# Patient Record
Sex: Female | Born: 1955
Health system: Southern US, Community
[De-identification: ages and names within clinical notes are randomized; demographics above are authoritative.]

## PROBLEM LIST (undated history)

## (undated) DIAGNOSIS — E785 Hyperlipidemia, unspecified: Secondary | ICD-10-CM

## (undated) DIAGNOSIS — B009 Herpesviral infection, unspecified: Secondary | ICD-10-CM

## (undated) DIAGNOSIS — F419 Anxiety disorder, unspecified: Secondary | ICD-10-CM

## (undated) DIAGNOSIS — N289 Disorder of kidney and ureter, unspecified: Secondary | ICD-10-CM

## (undated) DIAGNOSIS — K8020 Calculus of gallbladder without cholecystitis without obstruction: Secondary | ICD-10-CM

## (undated) DIAGNOSIS — E119 Type 2 diabetes mellitus without complications: Secondary | ICD-10-CM

## (undated) DIAGNOSIS — G43909 Migraine, unspecified, not intractable, without status migrainosus: Secondary | ICD-10-CM

## (undated) HISTORY — DX: Calculus of gallbladder without cholecystitis without obstruction: K80.20

## (undated) HISTORY — DX: Type 2 diabetes mellitus without complications: E11.9

## (undated) HISTORY — PX: APPENDECTOMY: SHX54

## (undated) HISTORY — PX: LITHOTRIPSY: SUR834

## (undated) HISTORY — DX: Hyperlipidemia, unspecified: E78.5

## (undated) HISTORY — DX: Anxiety disorder, unspecified: F41.9

## (undated) HISTORY — DX: Herpesviral infection, unspecified: B00.9

---

## 2001-05-04 ENCOUNTER — Other Ambulatory Visit: Admission: RE | Admit: 2001-05-04 | Discharge: 2001-05-04 | Payer: Self-pay | Admitting: *Deleted

## 2004-10-08 ENCOUNTER — Ambulatory Visit (HOSPITAL_COMMUNITY): Admission: RE | Admit: 2004-10-08 | Discharge: 2004-10-08 | Payer: Self-pay | Admitting: Obstetrics and Gynecology

## 2004-11-23 ENCOUNTER — Ambulatory Visit (HOSPITAL_COMMUNITY): Admission: RE | Admit: 2004-11-23 | Discharge: 2004-11-23 | Payer: Self-pay | Admitting: Urology

## 2005-02-06 ENCOUNTER — Ambulatory Visit (HOSPITAL_COMMUNITY): Admission: RE | Admit: 2005-02-06 | Discharge: 2005-02-06 | Payer: Self-pay | Admitting: Urology

## 2005-06-07 ENCOUNTER — Ambulatory Visit (HOSPITAL_COMMUNITY): Admission: RE | Admit: 2005-06-07 | Discharge: 2005-06-07 | Payer: Self-pay | Admitting: Urology

## 2006-01-13 ENCOUNTER — Ambulatory Visit (HOSPITAL_COMMUNITY): Admission: RE | Admit: 2006-01-13 | Discharge: 2006-01-13 | Payer: Self-pay | Admitting: Family Medicine

## 2006-01-27 ENCOUNTER — Ambulatory Visit (HOSPITAL_COMMUNITY): Admission: RE | Admit: 2006-01-27 | Discharge: 2006-01-27 | Payer: Self-pay | Admitting: Family Medicine

## 2006-01-29 ENCOUNTER — Ambulatory Visit (HOSPITAL_COMMUNITY): Admission: RE | Admit: 2006-01-29 | Discharge: 2006-01-29 | Payer: Self-pay | Admitting: Family Medicine

## 2006-01-31 ENCOUNTER — Ambulatory Visit (HOSPITAL_COMMUNITY): Admission: RE | Admit: 2006-01-31 | Discharge: 2006-01-31 | Payer: Self-pay | Admitting: Family Medicine

## 2007-07-22 ENCOUNTER — Ambulatory Visit (HOSPITAL_COMMUNITY): Admission: RE | Admit: 2007-07-22 | Discharge: 2007-07-22 | Payer: Self-pay | Admitting: Cardiovascular Disease

## 2007-07-24 ENCOUNTER — Ambulatory Visit (HOSPITAL_COMMUNITY): Admission: RE | Admit: 2007-07-24 | Discharge: 2007-07-24 | Payer: Self-pay | Admitting: Family Medicine

## 2007-07-30 ENCOUNTER — Encounter (HOSPITAL_COMMUNITY): Admission: RE | Admit: 2007-07-30 | Discharge: 2007-08-29 | Payer: Self-pay | Admitting: Family Medicine

## 2008-04-05 ENCOUNTER — Ambulatory Visit (HOSPITAL_COMMUNITY): Admission: RE | Admit: 2008-04-05 | Discharge: 2008-04-05 | Payer: Self-pay | Admitting: Family Medicine

## 2009-08-14 ENCOUNTER — Ambulatory Visit (HOSPITAL_COMMUNITY): Admission: RE | Admit: 2009-08-14 | Discharge: 2009-08-14 | Payer: Self-pay | Admitting: Family Medicine

## 2010-09-30 ENCOUNTER — Encounter: Payer: Self-pay | Admitting: Obstetrics and Gynecology

## 2011-04-24 ENCOUNTER — Other Ambulatory Visit: Payer: Self-pay | Admitting: Adult Health

## 2011-04-24 ENCOUNTER — Other Ambulatory Visit (HOSPITAL_COMMUNITY)
Admission: RE | Admit: 2011-04-24 | Discharge: 2011-04-24 | Disposition: A | Discharge status: Home / Self Care | Payer: BC Managed Care – HMO | Source: Ambulatory Visit | Attending: Obstetrics and Gynecology | Admitting: Obstetrics and Gynecology

## 2011-04-24 DIAGNOSIS — Z01419 Encounter for gynecological examination (general) (routine) without abnormal findings: Secondary | ICD-10-CM | POA: Insufficient documentation

## 2011-04-24 DIAGNOSIS — Z139 Encounter for screening, unspecified: Secondary | ICD-10-CM

## 2011-04-29 ENCOUNTER — Ambulatory Visit (HOSPITAL_COMMUNITY)
Admission: RE | Admit: 2011-04-29 | Discharge: 2011-04-29 | Disposition: A | Discharge status: Home / Self Care | Payer: BC Managed Care – HMO | Source: Ambulatory Visit | Attending: Adult Health | Admitting: Adult Health

## 2011-04-29 DIAGNOSIS — Z1231 Encounter for screening mammogram for malignant neoplasm of breast: Secondary | ICD-10-CM | POA: Insufficient documentation

## 2011-04-29 DIAGNOSIS — Z139 Encounter for screening, unspecified: Secondary | ICD-10-CM

## 2013-10-23 ENCOUNTER — Encounter (HOSPITAL_COMMUNITY): Payer: Self-pay | Admitting: Emergency Medicine

## 2013-10-23 ENCOUNTER — Emergency Department (HOSPITAL_COMMUNITY)
Admission: EM | Admit: 2013-10-23 | Discharge: 2013-10-23 | Disposition: A | Discharge status: Home / Self Care | Payer: Self-pay | Attending: Emergency Medicine | Admitting: Emergency Medicine

## 2013-10-23 ENCOUNTER — Emergency Department (HOSPITAL_COMMUNITY): Payer: BC Managed Care – HMO

## 2013-10-23 DIAGNOSIS — F411 Generalized anxiety disorder: Secondary | ICD-10-CM | POA: Insufficient documentation

## 2013-10-23 DIAGNOSIS — G43909 Migraine, unspecified, not intractable, without status migrainosus: Secondary | ICD-10-CM | POA: Insufficient documentation

## 2013-10-23 DIAGNOSIS — F419 Anxiety disorder, unspecified: Secondary | ICD-10-CM

## 2013-10-23 HISTORY — DX: Migraine, unspecified, not intractable, without status migrainosus: G43.909

## 2013-10-23 LAB — CBC WITH DIFFERENTIAL/PLATELET
Basophils Absolute: 0 10*3/uL (ref 0.0–0.1)
Basophils Relative: 0 % (ref 0–1)
EOS ABS: 0 10*3/uL (ref 0.0–0.7)
Eosinophils Relative: 1 % (ref 0–5)
HCT: 39.1 % (ref 36.0–46.0)
HEMOGLOBIN: 13.1 g/dL (ref 12.0–15.0)
LYMPHS ABS: 0.8 10*3/uL (ref 0.7–4.0)
LYMPHS PCT: 10 % — AB (ref 12–46)
MCH: 28.9 pg (ref 26.0–34.0)
MCHC: 33.5 g/dL (ref 30.0–36.0)
MCV: 86.3 fL (ref 78.0–100.0)
MONOS PCT: 3 % (ref 3–12)
Monocytes Absolute: 0.3 10*3/uL (ref 0.1–1.0)
NEUTROS PCT: 86 % — AB (ref 43–77)
Neutro Abs: 7.2 10*3/uL (ref 1.7–7.7)
Platelets: 155 10*3/uL (ref 150–400)
RBC: 4.53 MIL/uL (ref 3.87–5.11)
RDW: 13 % (ref 11.5–15.5)
WBC: 8.4 10*3/uL (ref 4.0–10.5)

## 2013-10-23 LAB — BASIC METABOLIC PANEL
BUN: 12 mg/dL (ref 6–23)
CHLORIDE: 101 meq/L (ref 96–112)
CO2: 25 meq/L (ref 19–32)
Calcium: 8.8 mg/dL (ref 8.4–10.5)
Creatinine, Ser: 0.74 mg/dL (ref 0.50–1.10)
GFR calc Af Amer: >90 mL/min (ref >90)
GFR calc non Af Amer: >90 mL/min (ref >90)
GLUCOSE: 173 mg/dL — AB (ref 70–99)
POTASSIUM: 3.7 meq/L (ref 3.7–5.3)
SODIUM: 139 meq/L (ref 137–147)

## 2013-10-23 MED ORDER — PROCHLORPERAZINE EDISYLATE 5 MG/ML IJ SOLN
10.0000 mg | Freq: Once | INTRAMUSCULAR | Status: AC
  Administered 2013-10-23: 10 mg via INTRAVENOUS
  Filled 2013-10-23: qty 2

## 2013-10-23 MED ORDER — ALPRAZOLAM 0.5 MG PO TABS: 0.5000 mg | ORAL_TABLET | Freq: Every evening | ORAL | Status: DC | PRN

## 2013-10-23 MED ORDER — SODIUM CHLORIDE 0.9 % IV BOLUS (SEPSIS)
500.0000 mL | Freq: Once | INTRAVENOUS | Status: AC
  Administered 2013-10-23: 500 mL via INTRAVENOUS

## 2013-10-23 MED ORDER — KETOROLAC TROMETHAMINE 30 MG/ML IJ SOLN
30.0000 mg | Freq: Once | INTRAMUSCULAR | Status: AC
  Administered 2013-10-23: 30 mg via INTRAVENOUS
  Filled 2013-10-23: qty 1

## 2013-10-23 MED ORDER — DIPHENHYDRAMINE HCL 50 MG/ML IJ SOLN
25.0000 mg | Freq: Once | INTRAMUSCULAR | Status: AC
  Administered 2013-10-23: 25 mg via INTRAVENOUS
  Filled 2013-10-23: qty 1

## 2013-10-23 NOTE — ED Notes (Addendum)
Per ems pt just lost husband one year ago today and got upset. EMS states was initially very anxious but that is gone and had progressively gotten to a migraine. Pt has eyes covered. Lights out for comfort. Pt alert/oriented. Morphine in route did not help per pt. Pt sees jagged edges but states is normal for her when she has migraines.

## 2013-10-23 NOTE — Discharge Instructions (Signed)
Migraine Headache °A migraine headache is an intense, throbbing pain on one or both sides of your head. A migraine can last for 30 minutes to several hours. °CAUSES  °The exact cause of a migraine headache is not always known. However, a migraine may be caused when nerves in the brain become irritated and release chemicals that cause inflammation. This causes pain. °Certain things may also trigger migraines, such as: °· Alcohol. °· Smoking. °· Stress. °· Menstruation. °· Aged cheeses. °· Foods or drinks that contain nitrates, glutamate, aspartame, or tyramine. °· Lack of sleep. °· Chocolate. °· Caffeine. °· Hunger. °· Physical exertion. °· Fatigue. °· Medicines used to treat chest pain (nitroglycerine), birth control pills, estrogen, and some blood pressure medicines. °SIGNS AND SYMPTOMS °· Pain on one or both sides of your head. °· Pulsating or throbbing pain. °· Severe pain that prevents daily activities. °· Pain that is aggravated by any physical activity. °· Nausea, vomiting, or both. °· Dizziness. °· Pain with exposure to bright lights, loud noises, or activity. °· General sensitivity to bright lights, loud noises, or smells. °Before you get a migraine, you may get warning signs that a migraine is coming (aura). An aura may include: °· Seeing flashing lights. °· Seeing bright spots, halos, or zig-zag lines. °· Having tunnel vision or blurred vision. °· Having feelings of numbness or tingling. °· Having trouble talking. °· Having muscle weakness. °DIAGNOSIS  °A migraine headache is often diagnosed based on: °· Symptoms. °· Physical exam. °· A CT scan or MRI of your head. These imaging tests cannot diagnose migraines, but they can help rule out other causes of headaches. °TREATMENT °Medicines may be given for pain and nausea. Medicines can also be given to help prevent recurrent migraines.  °HOME CARE INSTRUCTIONS °· Only take over-the-counter or prescription medicines for pain or discomfort as directed by your  health care provider. The use of long-term narcotics is not recommended. °· Lie down in a dark, quiet room when you have a migraine. °· Keep a journal to find out what may trigger your migraine headaches. For example, write down: °· What you eat and drink. °· How much sleep you get. °· Any change to your diet or medicines. °· Limit alcohol consumption. °· Quit smoking if you smoke. °· Get 7 9 hours of sleep, or as recommended by your health care provider. °· Limit stress. °· Keep lights dim if bright lights bother you and make your migraines worse. °SEEK IMMEDIATE MEDICAL CARE IF:  °· Your migraine becomes severe. °· You have a fever. °· You have a stiff neck. °· You have vision loss. °· You have muscular weakness or loss of muscle control. °· You start losing your balance or have trouble walking. °· You feel faint or pass out. °· You have severe symptoms that are different from your first symptoms. °MAKE SURE YOU:  °· Understand these instructions. °· Will watch your condition. °· Will get help right away if you are not doing well or get worse. °Document Released: 08/26/2005 Document Revised: 06/16/2013 Document Reviewed: 05/03/2013 °ExitCare® Patient Information ©2014 [employer]. ° °Panic Attacks °Panic attacks are sudden, short-lived surges of severe anxiety, fear, or discomfort. They may occur for no reason when you are relaxed, when you are anxious, or when you are sleeping. Panic attacks may occur for a number of reasons:  °· Healthy people occasionally have panic attacks in extreme, life-threatening situations, such as war or natural disasters. Normal anxiety is a protective mechanism of the   body that helps us react to danger (fight or flight response). °· Panic attacks are often seen with anxiety disorders, such as panic disorder, social anxiety disorder, generalized anxiety disorder, and phobias. Anxiety disorders cause excessive or uncontrollable anxiety. They may interfere with your relationships or  other life activities. °· Panic attacks are sometimes seen with other mental illnesses such as depression and posttraumatic stress disorder. °· Certain medical conditions, prescription medicines, and drugs of abuse can cause panic attacks. °SYMPTOMS  °Panic attacks start suddenly, peak within 20 minutes, and are accompanied by four or more of the following symptoms: °· Pounding heart or fast heart rate (palpitations). °· Sweating. °· Trembling or shaking. °· Shortness of breath or feeling smothered. °· Feeling choked. °· Chest pain or discomfort. °· Nausea or strange feeling in your stomach. °· Dizziness, lightheadedness, or feeling like you will faint. °· Chills or hot flushes. °· Numbness or tingling in your lips or hands and feet. °· Feeling that things are not real or feeling that you are not yourself. °· Fear of losing control or going crazy. °· Fear of dying. °Some of these symptoms can mimic serious medical conditions. For example, you may think you are having a heart attack. Although panic attacks can be very scary, they are not life threatening. °DIAGNOSIS  °Panic attacks are diagnosed through an assessment by your health care provider. Your health care provider will ask questions about your symptoms, such as where and when they occurred. Your health care provider will also ask about your medical history and use of alcohol and drugs, including prescription medicines. Your health care provider may order blood tests or other studies to rule out a serious medical condition. Your health care provider may refer you to a mental health professional for further evaluation. °TREATMENT  °· Most healthy people who have one or two panic attacks in an extreme, life-threatening situation will not require treatment. °· The treatment for panic attacks associated with anxiety disorders or other mental illness typically involves counseling with a mental health professional, medicine, or a combination of both. Your health  care provider will help determine what treatment is best for you. °· Panic attacks due to physical illness usually goes away with treatment of the illness. If prescription medicine is causing panic attacks, talk with your health care provider about stopping the medicine, decreasing the dose, or substituting another medicine. °· Panic attacks due to alcohol or drug abuse goes away with abstinence. Some adults need professional help in order to stop drinking or using drugs. °HOME CARE INSTRUCTIONS  °· Take all your medicines as prescribed.   °· Check with your health care provider before starting new prescription or over-the-counter medicines. °· Keep all follow up appointments with your health care provider. °SEEK MEDICAL CARE IF: °· You are not able to take your medicines as prescribed. °· Your symptoms do not improve or get worse. °SEEK IMMEDIATE MEDICAL CARE IF:  °· You experience panic attack symptoms that are different than your usual symptoms. °· You have serious thoughts about hurting yourself or others. °· You are taking medicine for panic attacks and have a serious side effect. °MAKE SURE YOU: °· Understand these instructions. °· Will watch your condition. °· Will get help right away if you are not doing well or get worse. °Document Released: 08/26/2005 Document Revised: 06/16/2013 Document Reviewed: 04/09/2013 °ExitCare® Patient Information ©2014 [employer]. ° °

## 2013-10-23 NOTE — ED Provider Notes (Signed)
CSN: [id number]     Arrival date & time 10/23/13  1742 History   First MD Initiated Contact with Patient 10/23/13 1747     Chief Complaint  Patient presents with  . Headache     (Consider location/radiation/quality/duration/timing/severity/associated sxs/prior Treatment) HPI Comments: Patient presents to the ER for evaluation of headache. Patient reports that symptoms began approximately an hour ago. She has a constant, severe pain on the top of her head. Her eyes are very sensitive to light. She has had nausea and dry heaves. The patient reports a previous history of migraines. She says that these symptoms today are very similar to her previous migraines. She has not noticed any fever, neck pain or stiffness, or extremity numbness, tingling or weakness. Does have some irregularity of her vision, but similar to previous migraines, no unusual features.  Patient reports that she lost her husband a month ago. She has been thinking about him a lot today because of Valentine's Day. She became extremely anxious prior to onset of the migraine symptoms.  Patient is a 58 y.o. female presenting with headaches.  Headache Associated symptoms: nausea and photophobia     Past Medical History  Diagnosis Date  . Migraine    History reviewed. No pertinent past surgical history. History reviewed. No pertinent family history. History  Substance Use Topics  . Smoking status: Never Smoker   . Smokeless tobacco: Not on file  . Alcohol Use: No   OB History   Grav Para Term Preterm Abortions TAB SAB Ect Mult Living                 Review of Systems  Eyes: Positive for photophobia.  Gastrointestinal: Positive for nausea.  Neurological: Positive for headaches.  Psychiatric/Behavioral: The patient is nervous/anxious.   All other systems reviewed and are negative.      Allergies  Review of patient's allergies indicates no known allergies.  Home Medications   Current Outpatient Rx  Name   Route  Sig  Dispense  Refill  . ALPRAZolam (XANAX) 0.5 MG tablet   Oral   Take 1 tablet (0.5 mg total) by mouth at bedtime as needed for anxiety.   10 tablet   0    BP 158/90  Pulse 87  Temp(Src) 97.5 F (36.4 C) (Oral)  Resp 18  SpO2 98% Physical Exam  Constitutional: She is oriented to person, place, and time. She appears well-developed and well-nourished. No distress.  HENT:  Head: Normocephalic and atraumatic.  Right Ear: Hearing normal.  Left Ear: Hearing normal.  Nose: Nose normal.  Mouth/Throat: Oropharynx is clear and moist and mucous membranes are normal.  Eyes: Conjunctivae and EOM are normal. Pupils are equal, round, and reactive to light.  Neck: Normal range of motion. Neck supple. No Brudzinski's sign and no Kernig's sign noted.  Cardiovascular: Regular rhythm, S1 normal and S2 normal.  Exam reveals no gallop and no friction rub.   No murmur heard. Pulmonary/Chest: Effort normal and breath sounds normal. No respiratory distress. She exhibits no tenderness.  Abdominal: Soft. Normal appearance and bowel sounds are normal. There is no hepatosplenomegaly. There is no tenderness. There is no rebound, no guarding, no tenderness at McBurney's point and negative Murphy's sign. No hernia.  Musculoskeletal: Normal range of motion.  Neurological: She is alert and oriented to person, place, and time. She has normal strength. No cranial nerve deficit or sensory deficit. Coordination normal. GCS eye subscore is 4. GCS verbal subscore is 5. GCS motor subscore  is 6.  5/5 strength bilateral upper and lower extremities  Skin: Skin is warm, dry and intact. No rash noted. No cyanosis.  Psychiatric: She has a normal mood and affect. Her speech is normal and behavior is normal. Thought content normal.    ED Course  Procedures (including critical care time) Labs Review Labs Reviewed  CBC WITH DIFFERENTIAL - Abnormal; Notable for the following:    Neutrophils Relative % 86 (*)     Lymphocytes Relative 10 (*)    All other components within normal limits  BASIC METABOLIC PANEL - Abnormal; Notable for the following:    Glucose, Bld 173 (*)    All other components within normal limits   Imaging Review Ct Head Wo Contrast  10/23/2013   CLINICAL DATA:  Headache.  EXAM: CT HEAD WITHOUT CONTRAST  TECHNIQUE: Contiguous axial images were obtained from the base of the skull through the vertex without intravenous contrast.  COMPARISON:  None.  FINDINGS: Bony calvarium appears intact. No mass effect or midline shift is noted. Ventricular size is within normal limits. There is no evidence of mass lesion, hemorrhage or acute infarction.  IMPRESSION: No gross intracranial abnormality seen.   Electronically Signed   By: Roque Lias M.D.   On: 10/23/2013 19:19    EKG Interpretation   None       MDM   Final diagnoses:  Anxiety  Migraine    Patient presents to the ER for evaluation of headache. Patient had severe anxiety earlier today after obtaining about the recent loss of her husband. She then noticed onset of constant headache which is similar to previous migraines. Patient had a normal neurologic exam. There are no concerning features in the history or examination. Patient's blood work was normal. Vital signs are normal except for very slight hypertension. Head CT unremarkable. Patient treated with Toradol, Compazine, Benadryl and Zofran. She has had significant improvement in her headache which is very reassuring. The patient will therefore be discharged to home. She can return for any worsening symptoms. Was given a prescription for 10 Xanax tablets to be used as needed for anxiety in the near future.    Gilda Crease, MD 10/23/13 2010

## 2013-10-23 NOTE — ED Notes (Signed)
Son stated he wanted it charted that pt has had blood vessels keep popping in one eye in past 3-4 months. Unknown which eye. edp aware

## 2013-10-23 NOTE — ED Notes (Signed)
Pt reporting improvement in pain level.  Reporting headache at 4/10 with movement.  Less when laying still.  Pt resting, no distress noted.

## 2014-11-16 ENCOUNTER — Other Ambulatory Visit (HOSPITAL_COMMUNITY): Payer: Self-pay | Admitting: Internal Medicine

## 2014-11-16 DIAGNOSIS — M8580 Other specified disorders of bone density and structure, unspecified site: Secondary | ICD-10-CM

## 2014-11-18 ENCOUNTER — Other Ambulatory Visit (HOSPITAL_COMMUNITY): Payer: Self-pay

## 2014-11-21 ENCOUNTER — Ambulatory Visit (HOSPITAL_COMMUNITY)
Admission: RE | Admit: 2014-11-21 | Discharge: 2014-11-21 | Disposition: A | Discharge status: Home / Self Care | Payer: BLUE CROSS/BLUE SHIELD | Source: Ambulatory Visit | Attending: Internal Medicine | Admitting: Internal Medicine

## 2014-11-21 DIAGNOSIS — Z780 Asymptomatic menopausal state: Secondary | ICD-10-CM | POA: Insufficient documentation

## 2014-11-21 DIAGNOSIS — M8580 Other specified disorders of bone density and structure, unspecified site: Secondary | ICD-10-CM | POA: Diagnosis not present

## 2014-11-22 ENCOUNTER — Other Ambulatory Visit (HOSPITAL_COMMUNITY): Payer: Self-pay | Admitting: Internal Medicine

## 2014-11-22 DIAGNOSIS — R945 Abnormal results of liver function studies: Secondary | ICD-10-CM

## 2014-11-25 ENCOUNTER — Ambulatory Visit (HOSPITAL_COMMUNITY)
Admission: RE | Admit: 2014-11-25 | Discharge: 2014-11-25 | Disposition: A | Discharge status: Home / Self Care | Payer: BLUE CROSS/BLUE SHIELD | Source: Ambulatory Visit | Attending: Internal Medicine | Admitting: Internal Medicine

## 2014-11-25 DIAGNOSIS — R945 Abnormal results of liver function studies: Secondary | ICD-10-CM

## 2016-01-24 DIAGNOSIS — N390 Urinary tract infection, site not specified: Secondary | ICD-10-CM | POA: Diagnosis not present

## 2016-04-30 DIAGNOSIS — E782 Mixed hyperlipidemia: Secondary | ICD-10-CM | POA: Diagnosis not present

## 2016-04-30 DIAGNOSIS — I10 Essential (primary) hypertension: Secondary | ICD-10-CM | POA: Diagnosis not present

## 2016-04-30 DIAGNOSIS — E119 Type 2 diabetes mellitus without complications: Secondary | ICD-10-CM | POA: Diagnosis not present

## 2016-05-06 DIAGNOSIS — E782 Mixed hyperlipidemia: Secondary | ICD-10-CM | POA: Diagnosis not present

## 2016-05-06 DIAGNOSIS — E119 Type 2 diabetes mellitus without complications: Secondary | ICD-10-CM | POA: Diagnosis not present

## 2016-05-06 DIAGNOSIS — L408 Other psoriasis: Secondary | ICD-10-CM | POA: Diagnosis not present

## 2016-05-06 DIAGNOSIS — F411 Generalized anxiety disorder: Secondary | ICD-10-CM | POA: Diagnosis not present

## 2016-07-30 ENCOUNTER — Ambulatory Visit (INDEPENDENT_AMBULATORY_CARE_PROVIDER_SITE_OTHER): Payer: BLUE CROSS/BLUE SHIELD | Admitting: Adult Health

## 2016-07-30 ENCOUNTER — Encounter: Payer: Self-pay | Admitting: Adult Health

## 2016-07-30 ENCOUNTER — Other Ambulatory Visit (HOSPITAL_COMMUNITY)
Admission: RE | Admit: 2016-07-30 | Discharge: 2016-07-30 | Disposition: A | Discharge status: Home / Self Care | Payer: BLUE CROSS/BLUE SHIELD | Source: Ambulatory Visit | Attending: Adult Health | Admitting: Adult Health

## 2016-07-30 VITALS — BP 140/86 | HR 98 | Ht 61.0 in | Wt 138.0 lb

## 2016-07-30 DIAGNOSIS — Z1151 Encounter for screening for human papillomavirus (HPV): Secondary | ICD-10-CM | POA: Diagnosis not present

## 2016-07-30 DIAGNOSIS — Z01419 Encounter for gynecological examination (general) (routine) without abnormal findings: Secondary | ICD-10-CM | POA: Insufficient documentation

## 2016-07-30 DIAGNOSIS — Z01411 Encounter for gynecological examination (general) (routine) with abnormal findings: Secondary | ICD-10-CM

## 2016-07-30 DIAGNOSIS — Z1211 Encounter for screening for malignant neoplasm of colon: Secondary | ICD-10-CM | POA: Diagnosis not present

## 2016-07-30 DIAGNOSIS — L300 Nummular dermatitis: Secondary | ICD-10-CM

## 2016-07-30 LAB — HEMOCCULT GUIAC POC 1CARD (OFFICE): FECAL OCCULT BLD: NEGATIVE

## 2016-07-30 MED ORDER — MOMETASONE FUROATE 0.1 % EX OINT: TOPICAL_OINTMENT | Freq: Every day | CUTANEOUS | 1 refills | Status: DC

## 2016-07-30 NOTE — Patient Instructions (Signed)
Physical in 1 year, pap in 3 if normal Mammogram advised Labs with PCP Colonoscopy advised  Follow up in 6 weeks after using elocon

## 2016-07-30 NOTE — Progress Notes (Addendum)
Patient ID: Astrid DraftsSharlene L Cantu, female   DOB: 05-May-1956, 60 y.o.   MRN: [id number] History of Present Illness: Mckinley JewelSharlene is a 60 year old white female, divorced in for a well woman gyn exam and pap.She is complaining of itchy patches on thighs for last 6-7 months that come and go. PCP is Dr Margo AyeHall.   Current Medications, Allergies, Past Medical History, Past Surgical History, Family History and Social History were reviewed in [facility] electronic medical record.     Review of Systems: Patient denies any headaches, hearing loss, fatigue, blurred vision, shortness of breath, chest pain, abdominal pain, problems with bowel movements, urination, or intercourse. No joint pain or mood swings.+itchy patches on thighs     Physical Exam:BP 140/86 (BP Location: Left Arm, Patient Position: Sitting, Cuff Size: Normal)   Pulse 98   Ht 5\' 1"  (1.549 m)   Wt 138 lb (62.6 kg)   BMI 26.07 kg/m  General:  Well developed, well nourished, no acute distress Skin:  Warm and dry Neck:  Midline trachea, normal thyroid, good ROM, no lymphadenopathy,no carotid bruits heard Lungs; Clear to auscultation bilaterally Breast:  No dominant palpable mass, retraction, or nipple discharge Cardiovascular: Regular rate and rhythm Abdomen:  Soft, non tender, no hepatosplenomegaly Pelvic:  External genitalia is normal in appearance, no lesions.  The vagina is pale with loss of moisture and rugae. Urethra has no lesions or masses. The cervix is smooth, pap with HPV perforemd.  Uterus is felt to be normal size, shape, and contour.  No adnexal masses or tenderness noted.Bladder is non tender, no masses felt. Rectal: Good sphincter tone, no polyps, or hemorrhoids felt.  Hemoccult negative. Extremities/musculoskeletal:  No swelling or varicosities noted, no clubbing or cyanosis, has round scaly like areas on back of thighs  Psych:  No mood changes, alert and cooperative,seems happy PHQ 2 score 0  Impression: 1. Encounter  for gynecological examination with Papanicolaou smear of cervix   2. Nummular eczema       Plan: Rx Elocon 0.1 % ointment use daily #45 gm with 1 refill Follow up in 6 weeks,may need biopsy if not clearing or try tanning bed Physical in 1 year, pap in 3 if normal Mammogram advised Labs with PCP Colonoscopy advised

## 2016-08-05 LAB — CYTOLOGY - PAP
Diagnosis: NEGATIVE
HPV: NOT DETECTED

## 2016-09-11 ENCOUNTER — Ambulatory Visit: Payer: BLUE CROSS/BLUE SHIELD | Admitting: Adult Health

## 2016-10-04 ENCOUNTER — Telehealth: Payer: Self-pay | Admitting: *Deleted

## 2016-10-04 NOTE — Telephone Encounter (Signed)
Left message x 1. JSY 

## 2016-10-07 NOTE — Telephone Encounter (Signed)
Pt had questions on when grand daughter is supposed to get Depo. Questions answered. JSY

## 2016-10-07 NOTE — Telephone Encounter (Signed)
Left message x 2. JSY 

## 2016-10-28 DIAGNOSIS — E782 Mixed hyperlipidemia: Secondary | ICD-10-CM | POA: Diagnosis not present

## 2016-10-28 DIAGNOSIS — I10 Essential (primary) hypertension: Secondary | ICD-10-CM | POA: Diagnosis not present

## 2016-10-28 DIAGNOSIS — E119 Type 2 diabetes mellitus without complications: Secondary | ICD-10-CM | POA: Diagnosis not present

## 2016-10-30 DIAGNOSIS — L409 Psoriasis, unspecified: Secondary | ICD-10-CM | POA: Diagnosis not present

## 2016-10-30 DIAGNOSIS — Z0000 Encounter for general adult medical examination without abnormal findings: Secondary | ICD-10-CM | POA: Diagnosis not present

## 2016-10-30 DIAGNOSIS — E782 Mixed hyperlipidemia: Secondary | ICD-10-CM | POA: Diagnosis not present

## 2016-10-30 DIAGNOSIS — R7301 Impaired fasting glucose: Secondary | ICD-10-CM | POA: Diagnosis not present

## 2016-12-12 DIAGNOSIS — R6889 Other general symptoms and signs: Secondary | ICD-10-CM | POA: Diagnosis not present

## 2016-12-12 DIAGNOSIS — M791 Myalgia: Secondary | ICD-10-CM | POA: Diagnosis not present

## 2016-12-12 DIAGNOSIS — N390 Urinary tract infection, site not specified: Secondary | ICD-10-CM | POA: Diagnosis not present

## 2016-12-12 DIAGNOSIS — R509 Fever, unspecified: Secondary | ICD-10-CM | POA: Diagnosis not present

## 2017-03-04 ENCOUNTER — Emergency Department (HOSPITAL_COMMUNITY)
Admission: EM | Admit: 2017-03-04 | Discharge: 2017-03-04 | Disposition: A | Discharge status: Home / Self Care | Payer: BLUE CROSS/BLUE SHIELD | Attending: Emergency Medicine | Admitting: Emergency Medicine

## 2017-03-04 ENCOUNTER — Encounter (HOSPITAL_COMMUNITY): Payer: Self-pay | Admitting: *Deleted

## 2017-03-04 ENCOUNTER — Emergency Department (HOSPITAL_COMMUNITY): Payer: BLUE CROSS/BLUE SHIELD

## 2017-03-04 DIAGNOSIS — Z79899 Other long term (current) drug therapy: Secondary | ICD-10-CM | POA: Diagnosis not present

## 2017-03-04 DIAGNOSIS — Y939 Activity, unspecified: Secondary | ICD-10-CM | POA: Diagnosis not present

## 2017-03-04 DIAGNOSIS — S20229A Contusion of unspecified back wall of thorax, initial encounter: Secondary | ICD-10-CM | POA: Insufficient documentation

## 2017-03-04 DIAGNOSIS — E119 Type 2 diabetes mellitus without complications: Secondary | ICD-10-CM | POA: Insufficient documentation

## 2017-03-04 DIAGNOSIS — Y999 Unspecified external cause status: Secondary | ICD-10-CM | POA: Diagnosis not present

## 2017-03-04 DIAGNOSIS — Z0389 Encounter for observation for other suspected diseases and conditions ruled out: Secondary | ICD-10-CM | POA: Diagnosis not present

## 2017-03-04 DIAGNOSIS — Y929 Unspecified place or not applicable: Secondary | ICD-10-CM | POA: Diagnosis not present

## 2017-03-04 DIAGNOSIS — Z7984 Long term (current) use of oral hypoglycemic drugs: Secondary | ICD-10-CM | POA: Insufficient documentation

## 2017-03-04 DIAGNOSIS — M549 Dorsalgia, unspecified: Secondary | ICD-10-CM | POA: Diagnosis not present

## 2017-03-04 DIAGNOSIS — W109XXA Fall (on) (from) unspecified stairs and steps, initial encounter: Secondary | ICD-10-CM | POA: Insufficient documentation

## 2017-03-04 DIAGNOSIS — R0602 Shortness of breath: Secondary | ICD-10-CM | POA: Diagnosis not present

## 2017-03-04 DIAGNOSIS — M545 Low back pain: Secondary | ICD-10-CM | POA: Diagnosis not present

## 2017-03-04 DIAGNOSIS — W19XXXA Unspecified fall, initial encounter: Secondary | ICD-10-CM

## 2017-03-04 DIAGNOSIS — R079 Chest pain, unspecified: Secondary | ICD-10-CM | POA: Diagnosis not present

## 2017-03-04 MED ORDER — HYDROMORPHONE HCL 1 MG/ML IJ SOLN
1.0000 mg | Freq: Once | INTRAMUSCULAR | Status: AC
Start: 2017-03-04 — End: 2017-03-04
  Administered 2017-03-04: 1 mg via INTRAMUSCULAR

## 2017-03-04 MED ORDER — HYDROMORPHONE HCL 1 MG/ML IJ SOLN
1.0000 mg | Freq: Once | INTRAMUSCULAR | Status: DC
  Filled 2017-03-04: qty 1

## 2017-03-04 MED ORDER — ONDANSETRON HCL 4 MG/2ML IJ SOLN
4.0000 mg | Freq: Once | INTRAMUSCULAR | Status: DC
  Filled 2017-03-04: qty 2

## 2017-03-04 MED ORDER — ONDANSETRON HCL 4 MG/2ML IJ SOLN
4.0000 mg | Freq: Once | INTRAMUSCULAR | Status: AC
  Administered 2017-03-04: 4 mg via INTRAMUSCULAR

## 2017-03-04 MED ORDER — HYDROCODONE-ACETAMINOPHEN 5-325 MG PO TABS: 1.0000 | ORAL_TABLET | Freq: Four times a day (QID) | ORAL | 0 refills | Status: DC | PRN

## 2017-03-04 NOTE — ED Provider Notes (Signed)
[facility]EM[facility]ENCY DEPT Provider Note   CSN: [id number] Arrival date & time: 03/04/17  1859     History   Chief Complaint Chief Complaint  Patient presents with  . Fall    HPI Margaret Cantu is a 61 y.o. female.  Patient fell on 3 steps and landed on her back.. Patient complains of pain in her upper back   The history is provided by the patient.  Fall  This is a new problem. The current episode started 12 to 24 hours ago. The problem occurs rarely. The problem has not changed since onset.Pertinent negatives include no chest pain, no abdominal pain and no headaches. Exacerbated by: movement. Nothing relieves the symptoms. She has tried nothing for the symptoms. The treatment provided no relief.    Past Medical History:  Diagnosis Date  . Anxiety   . Diabetes mellitus without complication (HCC)    pre-diabetes  . Gallstones   . Hyperlipidemia   . Migraine     Patient Active Problem List   Diagnosis Date Noted  . Encounter for gynecological examination with Papanicolaou smear of cervix 07/30/2016    Past Surgical History:  Procedure Laterality Date  . APPENDECTOMY      OB History    Gravida Para Term Preterm AB Living   4 4 3 1   4    SAB TAB Ectopic Multiple Live Births           4       Home Medications    Prior to Admission medications   Medication Sig Start Date End Date Taking? Authorizing Provider  ALPRAZolam Prudy Feeler) 1 MG tablet Take 1 mg by mouth at bedtime as needed for anxiety.   Yes [provider]  metFORMIN (GLUCOPHAGE) 500 MG tablet TAKE ONE TABLET BY MOUTH ONCE DAILY 07/24/16  Yes [provider]  mometasone (ELOCON) 0.1 % ointment Apply topically daily. Patient taking differently: Apply 1 application topically daily.  07/30/16  Yes Cyril Mourning A, NP  simvastatin (ZOCOR) 10 MG tablet TAKE ONE TABLET BY MOUTH ONCE DAILY AT BEDTIME 06/27/16  Yes [provider]  HYDROcodone-acetaminophen (NORCO/VICODIN) 5-325 MG  tablet Take 1 tablet by mouth every 6 (six) hours as needed for moderate pain. 03/04/17   Bethann Berkshire, MD    Family History Family History  Problem Relation Age of Onset  . Heart attack Maternal Grandmother   . Heart attack Father   . Alcohol abuse Father   . Other Mother        septic shock  . COPD Brother   . Other Sister        MVA  . COPD Brother     Social History Social History  Substance Use Topics  . Smoking status: Never Smoker  . Smokeless tobacco: Never Used  . Alcohol use Yes     Comment: every 6 months     Allergies   Patient has no known allergies.   Review of Systems Review of Systems  Constitutional: Negative for appetite change and fatigue.  HENT: Negative for congestion, ear discharge and sinus pressure.   Eyes: Negative for discharge.  Respiratory: Negative for cough.   Cardiovascular: Negative for chest pain.  Gastrointestinal: Negative for abdominal pain and diarrhea.  Genitourinary: Negative for frequency and hematuria.  Musculoskeletal: Positive for back pain.  Skin: Negative for rash.  Neurological: Negative for seizures and headaches.  Psychiatric/Behavioral: Negative for hallucinations.     Physical Exam Updated Vital Signs BP (!) 179/94 (BP Location:  Left Arm)   Pulse 87   Temp 98 F (36.7 C) (Temporal)   Resp (!) 22   Ht 5\' 2"  (1.575 m)   Wt 61.2 kg (135 lb)   SpO2 100%   BMI 24.69 kg/m   Physical Exam  Constitutional: She is oriented to person, place, and time. She appears well-developed.  HENT:  Head: Normocephalic.  Eyes: Conjunctivae and EOM are normal. No scleral icterus.  Neck: Neck supple. No thyromegaly present.  Cardiovascular: Normal rate and regular rhythm.  Exam reveals no gallop and no friction rub.   No murmur heard. Pulmonary/Chest: No stridor. She has no wheezes. She has no rales. She exhibits no tenderness.  Abdominal: She exhibits no distension. There is no tenderness. There is no rebound.    Musculoskeletal: Normal range of motion. She exhibits no edema.  Tender upper thoracic vertebra around T4  Lymphadenopathy:    She has no cervical adenopathy.  Neurological: She is oriented to person, place, and time. She exhibits normal muscle tone. Coordination normal.  Skin: No rash noted. No erythema.  Psychiatric: She has a normal mood and affect. Her behavior is normal.     ED Treatments / Results  Labs (all labs ordered are listed, but only abnormal results are displayed) Labs Reviewed - No data to display  EKG  EKG Interpretation None       Radiology Dg Chest 2 View  Result Date: 03/04/2017 CLINICAL DATA:  61 year old who fell down 3 steps at home, complaining of chest pain, shortness of breath and upper back pain. Initial encounter. EXAM: CHEST  2 VIEW COMPARISON:  04/05/2008, 07/22/2007 and CT chest 07/24/2007. FINDINGS: Cardiac silhouette normal in size. Thoracic aorta mildly atherosclerotic. Hilar and mediastinal contours otherwise unremarkable. Lungs clear. Bronchovascular markings normal. Pulmonary vascularity normal. No visible pleural effusions. No pneumothorax. Degenerative changes involving the thoracic and upper lumbar spine. IMPRESSION: No acute cardiopulmonary disease. Electronically Signed   By: Hulan Saashomas  Lawrence M.D.   On: 03/04/2017 20:07    Procedures Procedures (including critical care time)  Medications Ordered in ED Medications  HYDROmorphone (DILAUDID) injection 1 mg (1 mg Intramuscular Given 03/04/17 1954)  ondansetron (ZOFRAN) injection 4 mg (4 mg Intramuscular Given 03/04/17 1954)     Initial Impression / Assessment and Plan / ED Course  I have reviewed the triage vital signs and the nursing notes.  Pertinent labs & imaging results that were available during my care of the patient were reviewed by me and considered in my medical decision making (see chart for details).     Fall with contusion upper back. Patient will be placed on Vicodin and  will follow-up with her PCP  Final Clinical Impressions(s) / ED Diagnoses   Final diagnoses:  Fall, initial encounter    New Prescriptions New Prescriptions   HYDROCODONE-ACETAMINOPHEN (NORCO/VICODIN) 5-325 MG TABLET    Take 1 tablet by mouth every 6 (six) hours as needed for moderate pain.     Bethann BerkshireZammit, Jahnessa Vanduyn, MD 03/04/17 2100

## 2017-03-04 NOTE — ED Triage Notes (Signed)
Pt c/o chest pain and upper back pain after falling down her steps; pt states she fell down 3 steps after slipping up in her shoes; pt states it hurts to take a deep breath

## 2017-03-04 NOTE — Discharge Instructions (Signed)
Follow up with your md next week if not improving °

## 2017-03-04 NOTE — ED Notes (Signed)
To radiology

## 2017-03-10 DIAGNOSIS — M545 Low back pain: Secondary | ICD-10-CM | POA: Diagnosis not present

## 2017-04-14 ENCOUNTER — Ambulatory Visit (HOSPITAL_COMMUNITY)
Admission: RE | Admit: 2017-04-14 | Discharge: 2017-04-14 | Disposition: A | Discharge status: Home / Self Care | Payer: BLUE CROSS/BLUE SHIELD | Source: Ambulatory Visit | Attending: Internal Medicine | Admitting: Internal Medicine

## 2017-04-14 ENCOUNTER — Other Ambulatory Visit (HOSPITAL_COMMUNITY): Payer: Self-pay | Admitting: Internal Medicine

## 2017-04-14 DIAGNOSIS — R0781 Pleurodynia: Secondary | ICD-10-CM | POA: Diagnosis not present

## 2017-04-14 DIAGNOSIS — M545 Low back pain: Secondary | ICD-10-CM | POA: Diagnosis not present

## 2017-04-14 DIAGNOSIS — N200 Calculus of kidney: Secondary | ICD-10-CM | POA: Diagnosis not present

## 2017-04-14 DIAGNOSIS — M549 Dorsalgia, unspecified: Secondary | ICD-10-CM | POA: Diagnosis not present

## 2017-04-14 DIAGNOSIS — S299XXA Unspecified injury of thorax, initial encounter: Secondary | ICD-10-CM | POA: Diagnosis not present

## 2017-04-14 DIAGNOSIS — Z6823 Body mass index (BMI) 23.0-23.9, adult: Secondary | ICD-10-CM | POA: Diagnosis not present

## 2017-04-15 DIAGNOSIS — E119 Type 2 diabetes mellitus without complications: Secondary | ICD-10-CM | POA: Diagnosis not present

## 2017-04-15 DIAGNOSIS — E782 Mixed hyperlipidemia: Secondary | ICD-10-CM | POA: Diagnosis not present

## 2017-04-17 DIAGNOSIS — E782 Mixed hyperlipidemia: Secondary | ICD-10-CM | POA: Diagnosis not present

## 2017-04-17 DIAGNOSIS — F411 Generalized anxiety disorder: Secondary | ICD-10-CM | POA: Diagnosis not present

## 2017-04-17 DIAGNOSIS — R7301 Impaired fasting glucose: Secondary | ICD-10-CM | POA: Diagnosis not present

## 2017-04-17 DIAGNOSIS — L408 Other psoriasis: Secondary | ICD-10-CM | POA: Diagnosis not present

## 2017-05-07 DIAGNOSIS — M546 Pain in thoracic spine: Secondary | ICD-10-CM | POA: Diagnosis not present

## 2017-05-07 DIAGNOSIS — Z6824 Body mass index (BMI) 24.0-24.9, adult: Secondary | ICD-10-CM | POA: Diagnosis not present

## 2017-05-20 DIAGNOSIS — I10 Essential (primary) hypertension: Secondary | ICD-10-CM | POA: Diagnosis not present

## 2017-05-20 DIAGNOSIS — Z6824 Body mass index (BMI) 24.0-24.9, adult: Secondary | ICD-10-CM | POA: Diagnosis not present

## 2017-05-28 DIAGNOSIS — M546 Pain in thoracic spine: Secondary | ICD-10-CM | POA: Diagnosis not present

## 2017-05-28 DIAGNOSIS — M9902 Segmental and somatic dysfunction of thoracic region: Secondary | ICD-10-CM | POA: Diagnosis not present

## 2017-05-28 DIAGNOSIS — R0782 Intercostal pain: Secondary | ICD-10-CM | POA: Diagnosis not present

## 2017-05-29 DIAGNOSIS — R0782 Intercostal pain: Secondary | ICD-10-CM | POA: Diagnosis not present

## 2017-05-29 DIAGNOSIS — M9902 Segmental and somatic dysfunction of thoracic region: Secondary | ICD-10-CM | POA: Diagnosis not present

## 2017-05-29 DIAGNOSIS — M546 Pain in thoracic spine: Secondary | ICD-10-CM | POA: Diagnosis not present

## 2017-06-02 DIAGNOSIS — M546 Pain in thoracic spine: Secondary | ICD-10-CM | POA: Diagnosis not present

## 2017-06-02 DIAGNOSIS — R0782 Intercostal pain: Secondary | ICD-10-CM | POA: Diagnosis not present

## 2017-06-02 DIAGNOSIS — M9902 Segmental and somatic dysfunction of thoracic region: Secondary | ICD-10-CM | POA: Diagnosis not present

## 2017-06-06 DIAGNOSIS — R0782 Intercostal pain: Secondary | ICD-10-CM | POA: Diagnosis not present

## 2017-06-06 DIAGNOSIS — M9902 Segmental and somatic dysfunction of thoracic region: Secondary | ICD-10-CM | POA: Diagnosis not present

## 2017-06-06 DIAGNOSIS — M546 Pain in thoracic spine: Secondary | ICD-10-CM | POA: Diagnosis not present

## 2017-06-09 DIAGNOSIS — M546 Pain in thoracic spine: Secondary | ICD-10-CM | POA: Diagnosis not present

## 2017-06-09 DIAGNOSIS — M9902 Segmental and somatic dysfunction of thoracic region: Secondary | ICD-10-CM | POA: Diagnosis not present

## 2017-06-09 DIAGNOSIS — R0782 Intercostal pain: Secondary | ICD-10-CM | POA: Diagnosis not present

## 2017-06-10 DIAGNOSIS — M545 Low back pain: Secondary | ICD-10-CM | POA: Diagnosis not present

## 2017-06-10 DIAGNOSIS — I10 Essential (primary) hypertension: Secondary | ICD-10-CM | POA: Diagnosis not present

## 2017-06-13 DIAGNOSIS — M9902 Segmental and somatic dysfunction of thoracic region: Secondary | ICD-10-CM | POA: Diagnosis not present

## 2017-06-13 DIAGNOSIS — M546 Pain in thoracic spine: Secondary | ICD-10-CM | POA: Diagnosis not present

## 2017-06-13 DIAGNOSIS — R0782 Intercostal pain: Secondary | ICD-10-CM | POA: Diagnosis not present

## 2017-06-20 DIAGNOSIS — M5136 Other intervertebral disc degeneration, lumbar region: Secondary | ICD-10-CM | POA: Diagnosis not present

## 2017-06-20 DIAGNOSIS — M4854XA Collapsed vertebra, not elsewhere classified, thoracic region, initial encounter for fracture: Secondary | ICD-10-CM | POA: Diagnosis not present

## 2017-06-20 DIAGNOSIS — M47814 Spondylosis without myelopathy or radiculopathy, thoracic region: Secondary | ICD-10-CM | POA: Diagnosis not present

## 2017-06-20 DIAGNOSIS — M549 Dorsalgia, unspecified: Secondary | ICD-10-CM | POA: Diagnosis not present

## 2017-06-30 ENCOUNTER — Ambulatory Visit (INDEPENDENT_AMBULATORY_CARE_PROVIDER_SITE_OTHER): Payer: BLUE CROSS/BLUE SHIELD | Admitting: Orthopedic Surgery

## 2017-06-30 ENCOUNTER — Encounter: Payer: Self-pay | Admitting: Orthopedic Surgery

## 2017-06-30 VITALS — BP 150/92 | HR 80 | Ht 61.0 in | Wt 134.0 lb

## 2017-06-30 DIAGNOSIS — M546 Pain in thoracic spine: Secondary | ICD-10-CM

## 2017-06-30 DIAGNOSIS — G8929 Other chronic pain: Secondary | ICD-10-CM

## 2017-06-30 DIAGNOSIS — S22000S Wedge compression fracture of unspecified thoracic vertebra, sequela: Secondary | ICD-10-CM

## 2017-06-30 NOTE — Progress Notes (Signed)
NEW PATIENT OFFICE VISIT    Chief Complaint  Patient presents with  . Back Pain    thoracic spine pain s/p fall down steps 4 mo ago, landed on back     61 year old female fell in June landed on her back sustained a compression fracture of her thoracic spine. She went to chiropractic treatment and did not improve comes in complaining of thoracic back pain just left of the midline radiating around to the chest area.  She has taken some Advil and Tylenol still has continued dull aching constant radiating pain.    Review of Systems  Constitutional: Negative for fever.  Respiratory: Negative for shortness of breath.   Skin: Negative.   Neurological: Negative for tingling and sensory change. Seizures: .all.    Past Medical History:  Diagnosis Date  . Anxiety   . Diabetes mellitus without complication (HCC)    pre-diabetes  . Gallstones   . Hyperlipidemia   . Migraine     Past Surgical History:  Procedure Laterality Date  . APPENDECTOMY      Family History  Problem Relation Age of Onset  . Heart attack Maternal Grandmother   . Heart attack Father   . Alcohol abuse Father   . Other Mother        septic shock  . COPD Brother   . Other Sister        MVA  . COPD Brother    Social History  Substance Use Topics  . Smoking status: Never Smoker  . Smokeless tobacco: Never Used  . Alcohol use Yes     Comment: every 6 months    No Known Allergies   Current Meds  Medication Sig  . ALPRAZolam (XANAX) 1 MG tablet Take 1 mg by mouth at bedtime as needed for anxiety.  . metFORMIN (GLUCOPHAGE) 500 MG tablet TAKE ONE TABLET BY MOUTH ONCE DAILY  . mometasone (ELOCON) 0.1 % ointment Apply topically daily. (Patient taking differently: Apply 1 application topically daily. )  . simvastatin (ZOCOR) 10 MG tablet TAKE ONE TABLET BY MOUTH ONCE DAILY AT BEDTIME    BP (!) 150/92   Pulse 80   Ht 5\' 1"  (1.549 m)   Wt 134 lb (60.8 kg)   BMI 25.32 kg/m   Physical Exam   Constitutional: She is oriented to person, place, and time and well-developed, well-nourished, and in no distress. No distress.  Cardiovascular: Normal rate and intact distal pulses.   Lymphadenopathy:       Right: No supraclavicular adenopathy present.       Left: No supraclavicular adenopathy present.  Neurological: She is alert and oriented to person, place, and time. She has normal reflexes. She exhibits normal muscle tone. Coordination normal.  Skin: Skin is warm and dry. No rash noted. She is not diaphoretic. No erythema. No pallor.  Psychiatric: Judgment normal.    Physical Exam  Constitutional: She is oriented to person, place, and time and well-developed, well-nourished, and in no distress. No distress.  Cardiovascular: Normal rate and intact distal pulses.   Lymphadenopathy:       Right: No supraclavicular adenopathy present.       Left: No supraclavicular adenopathy present.  Neurological: She is alert and oriented to person, place, and time. She has normal reflexes. She exhibits normal muscle tone. Coordination normal.  Skin: Skin is warm and dry. No rash noted. She is not diaphoretic. No erythema. No pallor.  Psychiatric: Judgment normal.    Back Exam   Comments:  Grip strength is normal in both hands Sensation is normal in both hands   There are no long tract signs   No instability is detected       Back Exam   Tenderness  The patient is experiencing tenderness in the thoracic.  Range of Motion  Extension: normal  Flexion: normal   Comments:  Grip strength is normal in both hands Sensation is normal in both hands   There are no long tract signs   No instability is detected        Meds ordered this encounter  Medications  . oxyCODONE-acetaminophen (PERCOCET/ROXICET) 5-325 MG tablet    Sig: TAKE 1 TABLET BY MOUTH TWICE DAILY PRF PAIN    Refill:  0    Encounter Diagnoses  Name Primary?  . Chronic left-sided thoracic back pain Yes  .  Compression fracture of thoracic vertebra, sequela    The MRI and report evaluated. There is a compression fracture but it is chronic no acute edema thoracic level for  PLAN:   After discussion with the patient she is agreeable to physical therapy and a one-month follow-up

## 2017-07-03 ENCOUNTER — Encounter (HOSPITAL_COMMUNITY): Payer: Self-pay | Admitting: Physical Therapy

## 2017-07-03 ENCOUNTER — Ambulatory Visit (HOSPITAL_COMMUNITY): Payer: BLUE CROSS/BLUE SHIELD | Attending: Orthopedic Surgery | Admitting: Physical Therapy

## 2017-07-03 DIAGNOSIS — M62830 Muscle spasm of back: Secondary | ICD-10-CM

## 2017-07-03 DIAGNOSIS — M6281 Muscle weakness (generalized): Secondary | ICD-10-CM | POA: Insufficient documentation

## 2017-07-03 DIAGNOSIS — M546 Pain in thoracic spine: Secondary | ICD-10-CM | POA: Insufficient documentation

## 2017-07-03 DIAGNOSIS — R293 Abnormal posture: Secondary | ICD-10-CM

## 2017-07-03 NOTE — Therapy (Signed)
[facility] [address] [place] , [place], [address] Phone: [phone]   Fax:  [phone]  Physical Therapy Evaluation  Patient Details  Name: Margaret DraftsSharlene L Vessell MRN: [id number] Date of Birth: 05/22/1956 Referring Provider: Fuller CanadaStanley Harrison   Encounter Date: 07/03/2017      PT End of Session - 07/03/17 1134    Visit Number 1   Number of Visits 9   Date for PT Re-Evaluation 07/31/17   Authorization Type [employer] Other  (30 visit limit, 6 used)   Authorization Time Period 07/03/17 to 08/03/17   Authorization - Visit Number 1   Authorization - Number of Visits 24   PT Start Time 0947   PT Stop Time 1026   PT Time Calculation (min) 39 min   Activity Tolerance Patient tolerated treatment well   Behavior [facility] for tasks assessed/performed;Anxious      Past Medical History:  Diagnosis Date  . Anxiety   . Diabetes mellitus without complication (HCC)    pre-diabetes  . Gallstones   . Hyperlipidemia   . Migraine     Past Surgical History:  Procedure Laterality Date  . APPENDECTOMY      There were no vitals filed for this visit.       Subjective Assessment - 07/03/17 0948    Subjective Paitent fell June 26th down 3 steps had hit very hard to the point she could not breath for awhile; when she first fell they checked and found no broken ribs, the chiropractor is the one who x-rayed and found the compression fracture. THe pain is not as constant as it was, but she also can have very bad painful days, with pain radiating around her ribs and into her sternum. Occasional numbness/tingling in the legs. No falls or close calls since her big fall in June.    Pertinent History pre-DM    How long can you sit comfortably? 20 minutes on a bad day    How long can you stand comfortably? unsure    How long can you walk comfortably? she has to take breaks to sit down while shopping   Patient Stated Goals assurance that she is not  going to be bent over, correct the problem    Currently in Pain? Yes   Pain Score 3    Pain Location Back   Pain Orientation Left;Right   Pain Descriptors / Indicators Stabbing   Pain Type Chronic pain   Pain Radiating Towards can radiate out around ribs to sternum    Pain Onset More than a month ago   Pain Frequency Intermittent   Aggravating Factors  doing too much for too long of a length of time    Pain Relieving Factors ice    Effect of Pain on Daily Activities moderate             [facility] PT Assessment - 07/03/17 0001      Assessment   Medical Diagnosis thoracic pain/thoracic compression fracture    Referring Provider Fuller CanadaStanley Harrison    Onset Date/Surgical Date --  June 2018   Next MD Visit Dr. Romeo AppleHarrison in 2 weeks    Prior Therapy chiropractor, has been discharged      Precautions   Precautions None     Restrictions   Weight Bearing Restrictions No     Balance Screen   Has the patient fallen in the past 6 months Yes   How many times? 1   Has the patient  had a decrease in activity level because of a fear of falling?  Yes   Is the patient reluctant to leave their home because of a fear of falling?  No     Prior Function   Level of Independence Independent;Independent with basic ADLs;Independent with gait;Independent with transfers   Vocation Retired     AROM   Lumbar Flexion WNL    Lumbar Extension WNL    Lumbar - Right Side Bend WNL    Lumbar - Left Side Bend WNL    Thoracic Flexion --  DNT due to compression fracture    Thoracic Extension mild limitation, shoulder compensations    Thoracic - Right Side Bend WNL    Thoracic - Left Side Bend WNL    Thoracic - Right Rotation WNL    Thoracic - Left Rotation WNL      Strength   Right Hip Flexion 4/5   Right Hip Extension 4+/5   Right Hip ABduction 4+/5   Left Hip Flexion 5/5   Left Hip Extension 4+/5   Left Hip ABduction 4+/5   Right Knee Flexion 5/5   Right Knee Extension 5/5   Left Knee Flexion 5/5    Left Knee Extension 5/5   Right Ankle Dorsiflexion 5/5   Left Ankle Dorsiflexion 5/5     Palpation   Palpation comment severe L thoracic paraspinal muscle spasm      Standardized Balance Assessment   Standardized Balance Assessment Dynamic Gait Index     Dynamic Gait Index   Level Surface Normal   Change in Gait Speed Normal   Gait with Horizontal Head Turns Normal   Gait with Vertical Head Turns Normal   Gait and Pivot Turn Normal   Step Over Obstacle Normal   Step Around Obstacles Normal   Steps Normal   Total Score 24   DGI comment: 100%            Objective measurements completed on examination: See above findings.          [facility] Adult PT Treatment/Exercise - 07/03/17 0001      Exercises   Exercises Knee/Hip;Lumbar     Lumbar Exercises: Seated   Other Seated Lumbar Exercises seated thoracic extensions over towel roll and chair 1x10      Lumbar Exercises: Prone   Other Prone Lumbar Exercises prone W, T, Y, I 1x10                PT Education - 07/03/17 1133    Education provided Yes   Education Details general education regarding compression fracture and etiology, effect of pain on BP levels, possible reasons for ongoing pain; exam findings, prognosis, HEP, POC    Person(s) Educated Patient   Methods Explanation;Demonstration;Handout   Comprehension Verbalized understanding;Returned demonstration;Need further instruction          PT Short Term Goals - 07/03/17 1138      PT SHORT TERM GOAL #1   Title Patient to be able to maintain correct posture at least 75% of the time in order to reduce stress and increased pain at site of compression fracture    Time 2   Period Weeks   Status New   Target Date 07/17/17     PT SHORT TERM GOAL #2   Title Patient to demonstrate thoracic spine extension ROM as being full in order to show improved mobility and mechanics    Time 2   Period Weeks   Status New  PT SHORT TERM GOAL #3   Title  Patient to be compliant with correct performance of HEP, to be updated PRN    Time 1   Period Weeks   Status New   Target Date 07/10/17           PT Long Term Goals - 07/03/17 1140      PT LONG TERM GOAL #1   Title Patient to experience pain as being no more than 2/10 in area of compression fracture, with no radiating symptoms, in order to improve QOL    Time 4   Period Weeks   Status New   Target Date 07/31/17     PT LONG TERM GOAL #2   Title Patient to demonstrate a 50% improvement in thoracic hypomobility and paraspinal muscle spasm in order to reduce pain and improve QOL    Time 4   Period Weeks   Status New     PT LONG TERM GOAL #3   Title Paitent to verbalize and demonstrate modified mechanics for daily task performance in order to avoid flexed postures and aggravation/formation of additional compression fracture sites    Time 4   Period Weeks   Status New                Plan - 07/03/17 1135    Clinical Impression Statement Patient arrives after having a significant fall in June 2018 after which she had severe pain in her thoracic spine, which radiated around to her ribs; she went to her chiropractor for a few sessions, and he was the one who performed thoracic x-rays and discovered the compression fracture. Examination reveals postural deficits, impaired postural muscle and core strength, considerable paraspinal muscle spasm, and thoracic hypomobility. Recommend skilled PT services to address functional deficits, reduce pain, and assist in return to optimal level of function.    History and Personal Factors relevant to plan of care: fall taht started pain in June 2018, compression fracture thoraic spine    Clinical Presentation Stable   Clinical Presentation due to: orthopedic injury    Clinical Decision Making Low   Rehab Potential Excellent   Clinical Impairments Affecting Rehab Potential (+) young age, motivated; (-) compression fracture thoracic spine     PT Frequency 2x / week   PT Duration 4 weeks   PT Treatment/Interventions ADLs/Self Care Home Management;Biofeedback;Cryotherapy;Moist Heat;Functional mobility training;Therapeutic activities;Therapeutic exercise;Balance training;Neuromuscular re-education;Patient/family education;Manual techniques;Passive range of motion;Dry needling;Energy conservation;Taping   PT Next Visit Plan review initial eval/goals, HEP; postural muscle strengthening, thoracic extension, education on avoiding flexion during functional daily tasks    PT Home Exercise Plan Eval: prone W, T, I, Y; thoracic extensions over towel    Consulted and Agree with Plan of Care Patient      Patient will benefit from skilled therapeutic intervention in order to improve the following deficits and impairments:  Increased fascial restricitons, Pain, Improper body mechanics, Decreased mobility, Increased muscle spasms, Decreased range of motion, Decreased strength, Hypomobility, Impaired flexibility  Visit Diagnosis: Pain in thoracic spine - Plan: PT plan of care cert/re-cert  Abnormal posture - Plan: PT plan of care cert/re-cert  Muscle weakness (generalized) - Plan: PT plan of care cert/re-cert  Muscle spasm of back - Plan: PT plan of care cert/re-cert     Problem List Patient Active Problem List   Diagnosis Date Noted  . Encounter for gynecological examination with Papanicolaou smear of cervix 07/30/2016    Nedra Hai PT, DPT [phone]  [facility] [address] [place], [place], [address] Phone: [phone]   Fax:  [phone]  Name: DEVETTA HAGENOW MRN: [id number] Date of Birth: Oct 10, 1955

## 2017-07-03 NOTE — Patient Instructions (Signed)
   Prone Scapular Retraction - "W"  Lie with a pillow under your hips and raise your arms up off the ground with your shoulders and elbows at a 90 degree angle.  Concentrate on squeezing your shoulder blades together and maintain this position for the desired time.  You should feel your shoulder blades pinching together.  Repeat 10-15 times, twice a day.    Prone Scapular Retraction - Superman  Lie face down with a pillow under your hips and arms straight above your head.  Raise your arms above your head and hold them there for the desired amount of time.  Repeat 10-15 times, twice a day.     Prone Scapular Retraction - Airplane  Lie face down with a pillow under your hips and arms straight a 90 degree angle.  Raise your arms up from the ground and hold them there for the desired amount of time.  Concentrate on pinching your shoulder blades together.  Repeat 10-15 times, twice a day.    Prone Scapular Retraction - Merilyn BabaPeter Pan  Lie face down with a pillow under your hips and arms straight at your sides.  Raise your arms off from the ground and hold them there for the desired amount of time.  Repeat 10-15 times, twice a day.     Seated Thoracic Extension  Sit with your back against the top of a chair or with foam roll/noodle behind and perpendicular to your spine. Support your neck with your hands and tuck elbows forward and together. Gently arch back to mobilize spine at level where chair contacts spine. Hold 1-2 seconds then slowly return.  If it feels better, you may use a towel roll up against that part of your back like we did in clinic.  Repeat 10-15 times, twice a day.

## 2017-07-07 ENCOUNTER — Encounter (HOSPITAL_COMMUNITY): Payer: Self-pay | Admitting: Physical Therapy

## 2017-07-07 ENCOUNTER — Ambulatory Visit (HOSPITAL_COMMUNITY): Payer: BLUE CROSS/BLUE SHIELD | Admitting: Physical Therapy

## 2017-07-07 DIAGNOSIS — M546 Pain in thoracic spine: Secondary | ICD-10-CM | POA: Diagnosis not present

## 2017-07-07 DIAGNOSIS — M6281 Muscle weakness (generalized): Secondary | ICD-10-CM

## 2017-07-07 DIAGNOSIS — R293 Abnormal posture: Secondary | ICD-10-CM

## 2017-07-07 DIAGNOSIS — M62830 Muscle spasm of back: Secondary | ICD-10-CM

## 2017-07-07 NOTE — Therapy (Addendum)
[place], [place], [address] Phone: [phone]   Fax:  [phone]  Physical Therapy Treatment  Patient Details  Name: MORGIN HALLS MRN: [id number] Date of Birth: March 14, 1956 Referring Provider: Arther Abbott   Encounter Date: 07/07/2017      PT End of Session - 07/07/17 1429    Visit Number 2   Number of Visits 9   Date for PT Re-Evaluation 07/31/17   Authorization Type [employer] Other  (30 visit limit, 6 used)   Authorization Time Period 07/03/17 to 08/03/17   Authorization - Visit Number 2   Authorization - Number of Visits 24   PT Start Time 4847   PT Stop Time 1429   PT Time Calculation (min) 40 min   Activity Tolerance Patient tolerated treatment well   Behavior During Therapy [facility] for tasks assessed/performed      Past Medical History:  Diagnosis Date  . Anxiety   . Diabetes mellitus without complication (HCC)    pre-diabetes  . Gallstones   . Hyperlipidemia   . Migraine     Past Surgical History:  Procedure Laterality Date  . APPENDECTOMY      There were no vitals filed for this visit.      Subjective Assessment - 07/07/17 1353    Subjective Patient arrives stating that she is continuing to have pain, her BP has also been up but this was not always when her pain has been present    Pertinent History pre-DM    Patient Stated Goals assurance that she is not going to be bent over, correct the problem    Currently in Pain? Yes   Pain Score 4    Pain Location Neck   Pain Orientation Left                         [facility] Adult PT Treatment/Exercise - 07/07/17 0001      Therapeutic Activites    Therapeutic Activities Other Therapeutic Activities   Other Therapeutic Activities reviewed functional task performance      Lumbar Exercises: Standing   Other Standing Lumbar Exercises therband exercises: scap retractions with green TB 1x15 3 second holds; shoulder  extension 1x15 green TB; wall pushups 1x10     Other Standing Lumbar Exercises red TB D2 PNF pattern red TB 1x10; standing flies 1x10 red TB      Lumbar Exercises: Seated   Other Seated Lumbar Exercises seated thoracic extensions with U UE motion 1x10 B towel roll      Lumbar Exercises: Supine   Other Supine Lumbar Exercises supine thoracic extension stretch over towel roll 1x3 minutes      Manual Therapy   Manual Therapy Soft tissue mobilization   Manual therapy comments separate from all other skilled services    Soft tissue mobilization L paraspinals                 PT Education - 07/07/17 1429    Education provided Yes   Education Details review of initial eval/goals   Person(s) Educated Patient   Methods Explanation;Handout   Comprehension Verbalized understanding          PT Short Term Goals - 07/03/17 1138      PT SHORT TERM GOAL #1   Title Patient to be able to maintain correct posture at least 75% of the time in order to reduce stress and increased pain at site of compression fracture  Time 2   Period Weeks   Status New   Target Date 07/17/17     PT SHORT TERM GOAL #2   Title Patient to demonstrate thoracic spine extension ROM as being full in order to show improved mobility and mechanics    Time 2   Period Weeks   Status New     PT SHORT TERM GOAL #3   Title Patient to be compliant with correct performance of HEP, to be updated PRN    Time 1   Period Weeks   Status New   Target Date 07/10/17           PT Long Term Goals - 07/03/17 1140      PT LONG TERM GOAL #1   Title Patient to experience pain as being no more than 2/10 in area of compression fracture, with no radiating symptoms, in order to improve QOL    Time 4   Period Weeks   Status New   Target Date 07/31/17     PT LONG TERM GOAL #2   Title Patient to demonstrate a 50% improvement in thoracic hypomobility and paraspinal muscle spasm in order to reduce pain and improve QOL     Time 4   Period Weeks   Status New     PT LONG TERM GOAL #3   Title Paitent to verbalize and demonstrate modified mechanics for daily task performance in order to avoid flexed postures and aggravation/formation of additional compression fracture sites    Time 4   Period Weeks   Status New               Plan - 07/07/17 1430    Clinical Impression Statement Reviewed initial eval/goals and then proceeded with STM to L lumbar paraspinals due to patient complaints of stiffness in this area. Otherwise focused on postural training and thoracic extension mobility this session with reports of improved symptoms from patient today. Also discussed some strategies regarding task modification of functional activities in the home today.    Rehab Potential Excellent   Clinical Impairments Affecting Rehab Potential (+) young age, motivated; (-) compression fracture thoracic spine    PT Frequency 2x / week   PT Duration 4 weeks   PT Treatment/Interventions ADLs/Self Care Home Management;Biofeedback;Cryotherapy;Moist Heat;Functional mobility training;Therapeutic activities;Therapeutic exercise;Balance training;Neuromuscular re-education;Patient/family education;Manual techniques;Passive range of motion;Dry needling;Energy conservation;Taping   PT Next Visit Plan ; postural muscle strengthening, thoracic extension, education on avoiding flexion during functional daily tasks; continue with manual    PT Home Exercise Plan Eval: prone W, T, I, Y; thoracic extensions over towel    Consulted and Agree with Plan of Care Patient      Patient will benefit from skilled therapeutic intervention in order to improve the following deficits and impairments:  Increased fascial restricitons, Pain, Improper body mechanics, Decreased mobility, Increased muscle spasms, Decreased range of motion, Decreased strength, Hypomobility, Impaired flexibility  Visit Diagnosis: Pain in thoracic spine  Abnormal posture  Muscle  weakness (generalized)  Muscle spasm of back     Problem List Patient Active Problem List   Diagnosis Date Noted  . Encounter for gynecological examination with Papanicolaou smear of cervix 07/30/2016    Deniece Ree PT, DPT [place] [address] [place], [place], [address] Phone: [phone]   Fax:  [phone]  Name: ESTA CARMON MRN: [id number] Date of Birth: 13-Mar-1956  09/19/2017  PHYSICAL THERAPY DISCHARGE SUMMARY  Visits from Start of Care:  2  Current functional level related to goals / functional outcomes: See above   Remaining deficits: all   Education / Equipment: HEP Plan: Patient agrees to discharge.  Patient goals were not met. Patient is being discharged due to not returning since the last visit.  ?????       Pt called to cancel all visits until she got back in town but never called to reschedule.  Rayetta Humphrey, [place] CLT [phone]

## 2017-07-08 DIAGNOSIS — M542 Cervicalgia: Secondary | ICD-10-CM | POA: Diagnosis not present

## 2017-07-09 ENCOUNTER — Telehealth (HOSPITAL_COMMUNITY): Payer: Self-pay | Admitting: Internal Medicine

## 2017-07-09 NOTE — Telephone Encounter (Signed)
07/09/17  I called patient and asked when she would be back in town and she said she didn't know to cancel all appointments that were scheduled which I did

## 2017-07-09 NOTE — Telephone Encounter (Signed)
07/09/17  pt left a message to cx this appt... she also said to postpone all appointments until gets back into town

## 2017-07-10 ENCOUNTER — Ambulatory Visit (HOSPITAL_COMMUNITY): Payer: BLUE CROSS/BLUE SHIELD

## 2017-07-15 ENCOUNTER — Encounter (HOSPITAL_COMMUNITY): Payer: BLUE CROSS/BLUE SHIELD | Admitting: Physical Therapy

## 2017-07-17 ENCOUNTER — Encounter (HOSPITAL_COMMUNITY): Payer: BLUE CROSS/BLUE SHIELD | Admitting: Physical Therapy

## 2017-07-22 ENCOUNTER — Encounter (HOSPITAL_COMMUNITY): Payer: BLUE CROSS/BLUE SHIELD | Admitting: Physical Therapy

## 2017-07-24 ENCOUNTER — Encounter (HOSPITAL_COMMUNITY): Payer: BLUE CROSS/BLUE SHIELD | Admitting: Physical Therapy

## 2017-07-28 ENCOUNTER — Ambulatory Visit: Payer: BLUE CROSS/BLUE SHIELD | Admitting: Orthopedic Surgery

## 2017-07-29 ENCOUNTER — Encounter (HOSPITAL_COMMUNITY): Payer: BLUE CROSS/BLUE SHIELD | Admitting: Physical Therapy

## 2017-08-07 DIAGNOSIS — I10 Essential (primary) hypertension: Secondary | ICD-10-CM | POA: Diagnosis not present

## 2018-01-22 DIAGNOSIS — M542 Cervicalgia: Secondary | ICD-10-CM | POA: Diagnosis not present

## 2018-01-22 DIAGNOSIS — E119 Type 2 diabetes mellitus without complications: Secondary | ICD-10-CM | POA: Diagnosis not present

## 2018-01-22 DIAGNOSIS — M25511 Pain in right shoulder: Secondary | ICD-10-CM | POA: Diagnosis not present

## 2018-01-28 DIAGNOSIS — R7301 Impaired fasting glucose: Secondary | ICD-10-CM | POA: Diagnosis not present

## 2018-01-28 DIAGNOSIS — E782 Mixed hyperlipidemia: Secondary | ICD-10-CM | POA: Diagnosis not present

## 2018-01-28 DIAGNOSIS — Z0000 Encounter for general adult medical examination without abnormal findings: Secondary | ICD-10-CM | POA: Diagnosis not present

## 2018-01-28 DIAGNOSIS — L409 Psoriasis, unspecified: Secondary | ICD-10-CM | POA: Diagnosis not present

## 2018-04-10 DIAGNOSIS — Z6824 Body mass index (BMI) 24.0-24.9, adult: Secondary | ICD-10-CM | POA: Diagnosis not present

## 2018-04-10 DIAGNOSIS — R42 Dizziness and giddiness: Secondary | ICD-10-CM | POA: Diagnosis not present

## 2018-04-10 DIAGNOSIS — L0231 Cutaneous abscess of buttock: Secondary | ICD-10-CM | POA: Diagnosis not present

## 2018-04-14 ENCOUNTER — Other Ambulatory Visit: Payer: Self-pay

## 2018-04-14 ENCOUNTER — Encounter (HOSPITAL_COMMUNITY): Payer: Self-pay | Admitting: Emergency Medicine

## 2018-04-14 ENCOUNTER — Emergency Department (HOSPITAL_COMMUNITY)
Admission: EM | Admit: 2018-04-14 | Discharge: 2018-04-14 | Disposition: A | Discharge status: Home / Self Care | Payer: BLUE CROSS/BLUE SHIELD | Attending: Emergency Medicine | Admitting: Emergency Medicine

## 2018-04-14 DIAGNOSIS — E119 Type 2 diabetes mellitus without complications: Secondary | ICD-10-CM | POA: Diagnosis not present

## 2018-04-14 DIAGNOSIS — Z7984 Long term (current) use of oral hypoglycemic drugs: Secondary | ICD-10-CM | POA: Insufficient documentation

## 2018-04-14 DIAGNOSIS — Z6824 Body mass index (BMI) 24.0-24.9, adult: Secondary | ICD-10-CM | POA: Diagnosis not present

## 2018-04-14 DIAGNOSIS — Z79899 Other long term (current) drug therapy: Secondary | ICD-10-CM | POA: Diagnosis not present

## 2018-04-14 DIAGNOSIS — L0291 Cutaneous abscess, unspecified: Secondary | ICD-10-CM

## 2018-04-14 DIAGNOSIS — F419 Anxiety disorder, unspecified: Secondary | ICD-10-CM | POA: Diagnosis not present

## 2018-04-14 DIAGNOSIS — L0231 Cutaneous abscess of buttock: Secondary | ICD-10-CM | POA: Diagnosis not present

## 2018-04-14 DIAGNOSIS — R222 Localized swelling, mass and lump, trunk: Secondary | ICD-10-CM | POA: Diagnosis not present

## 2018-04-14 MED ORDER — POVIDONE-IODINE 10 % EX SOLN
CUTANEOUS | Status: DC | PRN
  Filled 2018-04-14: qty 15

## 2018-04-14 MED ORDER — IBUPROFEN 800 MG PO TABS: 800.0000 mg | ORAL_TABLET | Freq: Three times a day (TID) | ORAL | 0 refills | Status: DC

## 2018-04-14 MED ORDER — DOXYCYCLINE HYCLATE 100 MG PO CAPS: 100.0000 mg | ORAL_CAPSULE | Freq: Two times a day (BID) | ORAL | 0 refills | Status: DC

## 2018-04-14 MED ORDER — POVIDONE-IODINE 10 % EX SOLN
CUTANEOUS | Status: AC
  Filled 2018-04-14: qty 15

## 2018-04-14 MED ORDER — LIDOCAINE HCL (PF) 1 % IJ SOLN
INTRAMUSCULAR | Status: AC
  Filled 2018-04-14: qty 15

## 2018-04-14 MED ORDER — LIDOCAINE HCL (PF) 1 % IJ SOLN
15.0000 mL | Freq: Once | INTRAMUSCULAR | Status: DC
  Filled 2018-04-14: qty 15

## 2018-04-14 NOTE — ED Provider Notes (Signed)
[facility] EMERGENCY DEPARTMENT Provider Note   CSN: [id number] Arrival date & time: 04/14/18  1542     History   Chief Complaint Chief Complaint  Patient presents with  . Abscess    HPI Margaret Cantu is a 62 y.o. female with a history of well controlled diabetes, stating last cbg was 74 with an abscess in her right gluteal cleft. She was seen by her pcp and placed on doxycycline 4 days ago, in addition she has been using warm water soaks but the abscess site has refused to drain although it has become very soft and raised at its center.  She reports having firm tracking along her anterior right groin and had tender lymph nodes in her right groin which have improved since starting the antibiotics.  She was seen by her pcp today and was sent here to be evaluated by Dr. Henreitta Leber.  She denies fevers, chills, nausea, body aches or any other constitutional symptoms.  The history is provided by the patient.    Past Medical History:  Diagnosis Date  . Anxiety   . Diabetes mellitus without complication (HCC)    pre-diabetes  . Gallstones   . Hyperlipidemia   . Migraine     Patient Active Problem List   Diagnosis Date Noted  . Encounter for gynecological examination with Papanicolaou smear of cervix 07/30/2016    Past Surgical History:  Procedure Laterality Date  . APPENDECTOMY       OB History    Gravida  4   Para  4   Term  3   Preterm  1   AB      Living  4     SAB      TAB      Ectopic      Multiple      Live Births  4            Home Medications    Prior to Admission medications   Medication Sig Start Date End Date Taking? Authorizing Provider  ALPRAZolam Prudy Feeler) 1 MG tablet Take 1 mg by mouth at bedtime as needed for anxiety.    [provider]  metFORMIN (GLUCOPHAGE) 500 MG tablet TAKE ONE TABLET BY MOUTH ONCE DAILY 07/24/16   [provider]  mometasone (ELOCON) 0.1 % ointment Apply topically daily. Patient taking  differently: Apply 1 application topically daily.  07/30/16   Adline Potter, NP  oxyCODONE-acetaminophen (PERCOCET/ROXICET) 5-325 MG tablet TAKE 1 TABLET BY MOUTH TWICE DAILY PRF PAIN 06/10/17   [provider]  simvastatin (ZOCOR) 10 MG tablet TAKE ONE TABLET BY MOUTH ONCE DAILY AT BEDTIME 06/27/16   [provider]    Family History Family History  Problem Relation Age of Onset  . Heart attack Maternal Grandmother   . Heart attack Father   . Alcohol abuse Father   . Other Mother        septic shock  . COPD Brother   . Other Sister        MVA  . COPD Brother     Social History Social History   Tobacco Use  . Smoking status: Never Smoker  . Smokeless tobacco: Never Used  Substance Use Topics  . Alcohol use: Yes    Comment: every 6 months  . Drug use: No     Allergies   Patient has no known allergies.   Review of Systems Review of Systems  Constitutional: Negative for chills and fever.  Respiratory:  Negative for shortness of breath and wheezing.   Skin:       Negative except as mentioned in HPI.   Neurological: Negative for numbness.     Physical Exam Updated Vital Signs BP (!) 162/90 (BP Location: Right Arm)   Pulse 87   Temp 98.2 F (36.8 C) (Oral)   Resp 16   Ht 5\' 2"  (1.575 m)   Wt 59.4 kg (131 lb)   SpO2 99%   BMI 23.96 kg/m   Physical Exam  Constitutional: She appears well-developed and well-nourished. No distress.  HENT:  Head: Normocephalic.  Cardiovascular: Normal rate.  Pulmonary/Chest: Effort normal.  Musculoskeletal: Normal range of motion. She exhibits no edema.  Skin:  8 cm area of induration and erythema right gluteal cleft with subtle induration tracking along the anterior right groin.  Raised, fluctuant central abscess without drainage.  Right inguinal nodes palpable but nontender.    Psychiatric:  anxious     ED Treatments / Results  Labs (all labs ordered are listed, but only abnormal results are  displayed) Labs Reviewed - No data to display  EKG None  Radiology No results found.  Procedures Procedures (including critical care time)  I & D per Dr. Henreitta LeberBridges  Medications Ordered in ED Medications  lidocaine (PF) (XYLOCAINE) 1 % injection 15 mL (has no administration in time range)  povidone-iodine (BETADINE) 10 % external solution (has no administration in time range)     Initial Impression / Assessment and Plan / ED Course  I have reviewed the triage vital signs and the nursing notes.  Pertinent labs & imaging results that were available during my care of the patient were reviewed by me and considered in my medical decision making (see chart for details).     Discussed I&D procedure with pt who expressed great concern about having this done while she is awake, expresses great anxiety.  Discussed with Dr. Henreitta LeberBridges who will see pt in ed.  Final Clinical Impressions(s) / ED Diagnoses   Final diagnoses:  Abscess    ED Discharge Orders    None       Victoriano Laindol, Karalyne Nusser, PA-C 04/14/18 1726    Mesner, Barbara CowerJason, MD 04/14/18 2130

## 2018-04-14 NOTE — Procedures (Signed)
[facility] Procedure Note  04/14/2018 Pre-procedure Diagnosis: Right thigh abscess/ cellulitis    Postoperative Diagnosis: Right thigh abscess/ cellulitis ? Possible infected cyst    Procedure(s) Performed: Incision and drainage of superficial abscess    Surgeon: Leatrice JewelsLindsay C. Henreitta LeberBridges, MD   Assistants: No qualified resident was available    Anesthesia: 1% lidocaine   Specimens: None    Estimated Blood Loss: Minimal  Wound Class:Infected/ dirty    Indications: Ms. Ricky StabsBenfied is a 62 yo with an area of fluctuance in the right inner thigh and cellulitis. She presented to the ED for I&D from her PCP office. Verbal consent was obtained from the patient.   Findings: US assessment < 1 cm fluid pocket superficial and < 1cm from the skin    Procedure: The right innner was prepared and draped in the usual sterile fashion. 1% lidocaine was used to numb the area. I warned the patient that given the pH of the tissue that numbing may not work, but that this was superficial and aside from pain medication did not really warrant more aggressive anesthesia.  A 1.5cm incision was made in the area overlying the fluid pocket I appreciated with US.  This area opened to a cavity but no liquid drained. There was minimal globular purulence noted which was picked out from the cavity.  This could be secondary to the area clearing up after antibiotics. This cavity also may represent an infected cyst, but it is difficult to say at this time. The area had minimal bleeding. No packing was needed. It was irrigated with normal saline.  A dry gauze dressing was placed with paper tape.    Algis GreenhouseLindsay Verba Ainley, MD [facility] [address] [place], [place] [id number] [phone] (office)

## 2018-04-14 NOTE — Consult Note (Signed)
[facility] Consult  Reason for Consult: Right inner thigh abscess  Referring Physician:  Dr.  Scharlene Gloss office/ Burgess Amor PA (ED)   Chief Complaint    Abscess      Margaret Cantu is a 62 y.o. female.  HPI: Margaret Cantu is a 62 yo with DM, HLD who presented to her PCP a few days prior with cellulitis and some induration in her right groin area. She had prior history of some "boils" in her breast as a teenager but nothing in her groin region.  She was given antibiotis and close follow up and was revaluated jsut prior to coming to the ED.  She was noted to have an area that was starting to come to a head but had not ruptured. Dr. Scharlene Gloss office attempted to numb the patient for I&D but this was not successful per report and she was sent to the ED after they called about the patient.  She reports that overall the erythema had improved, and she had tried to rupture the "boil" but had not had any success. She was very anxious about any procedure and wanted "to be knocked out" for her I&D.  Past Medical History:  Diagnosis Date  . Anxiety   . Diabetes mellitus without complication (HCC)    pre-diabetes  . Gallstones   . Hyperlipidemia   . Migraine     Past Surgical History:  Procedure Laterality Date  . APPENDECTOMY      Family History  Problem Relation Age of Onset  . Heart attack Maternal Grandmother   . Heart attack Father   . Alcohol abuse Father   . Other Mother        septic shock  . COPD Brother   . Other Sister        MVA  . COPD Brother     Social History   Tobacco Use  . Smoking status: Never Smoker  . Smokeless tobacco: Never Used  Substance Use Topics  . Alcohol use: Yes    Comment: every 6 months  . Drug use: No    Medications: I have reviewed the patient's current medications. No current facility-administered medications for this encounter.    Current Outpatient Medications  Medication Sig Dispense Refill Last Dose  . ALPRAZolam  (XANAX) 1 MG tablet Take 1 mg by mouth at bedtime as needed for anxiety.   Taking  . doxycycline (VIBRAMYCIN) 100 MG capsule Take 1 capsule (100 mg total) by mouth 2 (two) times daily. 10 capsule 0   . ibuprofen (ADVIL,MOTRIN) 800 MG tablet Take 1 tablet (800 mg total) by mouth 3 (three) times daily. 21 tablet 0   . metFORMIN (GLUCOPHAGE) 500 MG tablet TAKE ONE TABLET BY MOUTH ONCE DAILY  5 Taking  . mometasone (ELOCON) 0.1 % ointment Apply topically daily. (Patient taking differently: Apply 1 application topically daily. ) 45 g 1 Taking  . oxyCODONE-acetaminophen (PERCOCET/ROXICET) 5-325 MG tablet TAKE 1 TABLET BY MOUTH TWICE DAILY PRF PAIN  0 Not Taking  . simvastatin (ZOCOR) 10 MG tablet TAKE ONE TABLET BY MOUTH ONCE DAILY AT BEDTIME  5 Taking    No Known Allergies  ROS:  A comprehensive review of systems was negative except for: Musculoskeletal: positive for right innner thigh swelling and tenderness, and redness/ area that was raised but not ruptured  Blood pressure 128/87, pulse 84, temperature 98.2 F (36.8 C), temperature source Oral, resp. rate 16, height 5\' 2"  (1.575 m), weight 131 lb (59.4 kg), SpO2  96 %. Physical Exam  Constitutional: She is oriented to person, place, and time. She appears well-developed and well-nourished.  HENT:  Head: Normocephalic and atraumatic.  Eyes: Pupils are equal, round, and reactive to light.  Neck: Normal range of motion.  Cardiovascular: Normal rate.  Pulmonary/Chest: Effort normal.  Abdominal: Soft. She exhibits no distension. There is no tenderness.  Musculoskeletal: Normal range of motion. She exhibits no edema.  Neurological: She is alert and oriented to person, place, and time.  Skin: Skin is warm and dry.  Right inner thigh with some erythema and induration and a small 1.5cm raised area with minimal fluctuance. US used to assess area, small < 1cm fluid pocket noted  Psychiatric: She has a normal mood and affect. Her behavior is normal.  Judgment and thought content normal.  Vitals reviewed.   Results: None   Assessment & Plan:  Margaret Cantu is a 62 y.o. female with a small superficial abscess/ cellulitis on her right inner thigh.  She is very anxious but have told her that this is not an appropriate abscess for general anesthesia or even for sedation given the superficial nature.  We will attempt to numb the area and open it up to drain.  She is agreeable for this attempt.  Verbal consent was obtained after discussing the risk and benefits of bleeding, recurring abscesses, and possibility that no drainage will be produced due to the size.   See separate procedure note for I&D.  Following I&D, home with 800mg  ibuprofen (requested by patient). NO packing needed. Doxycycline for 5 additional days (has 2 days remaining) Follow up with Dr. Scharlene GlossHall's office, they will notify us with issues Appreciate EDs help.   All questions were answered to the satisfaction of the patient and family.  Margaret Cantu 04/14/2018, 6:14 PM

## 2018-04-14 NOTE — Discharge Instructions (Addendum)
Get the prescription for additional antibiotics filled to extend this treatment for 5 days.  Plan to see Toni AmendCourtney for a recheck as planned.

## 2018-04-14 NOTE — ED Triage Notes (Signed)
Patient complaining of abscess to right buttock x 4 days. States area is draining.

## 2018-04-17 DIAGNOSIS — Z6824 Body mass index (BMI) 24.0-24.9, adult: Secondary | ICD-10-CM | POA: Diagnosis not present

## 2018-04-17 DIAGNOSIS — L0231 Cutaneous abscess of buttock: Secondary | ICD-10-CM | POA: Diagnosis not present

## 2018-07-20 DIAGNOSIS — E782 Mixed hyperlipidemia: Secondary | ICD-10-CM | POA: Diagnosis not present

## 2018-07-20 DIAGNOSIS — R7301 Impaired fasting glucose: Secondary | ICD-10-CM | POA: Diagnosis not present

## 2018-08-04 DIAGNOSIS — F411 Generalized anxiety disorder: Secondary | ICD-10-CM | POA: Diagnosis not present

## 2018-08-04 DIAGNOSIS — Z0000 Encounter for general adult medical examination without abnormal findings: Secondary | ICD-10-CM | POA: Diagnosis not present

## 2018-08-04 DIAGNOSIS — E782 Mixed hyperlipidemia: Secondary | ICD-10-CM | POA: Diagnosis not present

## 2018-08-04 DIAGNOSIS — Z23 Encounter for immunization: Secondary | ICD-10-CM | POA: Diagnosis not present

## 2018-08-04 DIAGNOSIS — R7301 Impaired fasting glucose: Secondary | ICD-10-CM | POA: Diagnosis not present

## 2018-08-04 DIAGNOSIS — Z124 Encounter for screening for malignant neoplasm of cervix: Secondary | ICD-10-CM | POA: Diagnosis not present

## 2018-08-17 ENCOUNTER — Other Ambulatory Visit (HOSPITAL_COMMUNITY): Payer: Self-pay | Admitting: Internal Medicine

## 2018-08-17 DIAGNOSIS — Z1231 Encounter for screening mammogram for malignant neoplasm of breast: Secondary | ICD-10-CM

## 2018-09-09 HISTORY — PX: BREAST BIOPSY: SHX20

## 2018-09-14 ENCOUNTER — Ambulatory Visit (HOSPITAL_COMMUNITY)
Admission: RE | Admit: 2018-09-14 | Discharge: 2018-09-14 | Disposition: A | Discharge status: Home / Self Care | Payer: BLUE CROSS/BLUE SHIELD | Source: Ambulatory Visit | Attending: Internal Medicine | Admitting: Internal Medicine

## 2018-09-14 DIAGNOSIS — Z1231 Encounter for screening mammogram for malignant neoplasm of breast: Secondary | ICD-10-CM

## 2018-09-24 ENCOUNTER — Other Ambulatory Visit (HOSPITAL_COMMUNITY): Payer: Self-pay | Admitting: Internal Medicine

## 2018-09-24 DIAGNOSIS — R921 Mammographic calcification found on diagnostic imaging of breast: Secondary | ICD-10-CM

## 2018-09-29 ENCOUNTER — Ambulatory Visit (HOSPITAL_COMMUNITY)
Admission: RE | Admit: 2018-09-29 | Discharge: 2018-09-29 | Disposition: A | Discharge status: Home / Self Care | Payer: BLUE CROSS/BLUE SHIELD | Source: Ambulatory Visit | Attending: Internal Medicine | Admitting: Internal Medicine

## 2018-09-29 DIAGNOSIS — R921 Mammographic calcification found on diagnostic imaging of breast: Secondary | ICD-10-CM | POA: Insufficient documentation

## 2018-10-05 ENCOUNTER — Other Ambulatory Visit: Payer: Self-pay | Admitting: Adult Health Nurse Practitioner

## 2018-10-05 DIAGNOSIS — R921 Mammographic calcification found on diagnostic imaging of breast: Secondary | ICD-10-CM

## 2018-10-16 ENCOUNTER — Ambulatory Visit
Admission: RE | Admit: 2018-10-16 | Discharge: 2018-10-16 | Disposition: A | Discharge status: Home / Self Care | Payer: BLUE CROSS/BLUE SHIELD | Source: Ambulatory Visit | Attending: Adult Health Nurse Practitioner | Admitting: Adult Health Nurse Practitioner

## 2018-10-16 DIAGNOSIS — N6011 Diffuse cystic mastopathy of right breast: Secondary | ICD-10-CM | POA: Diagnosis not present

## 2018-10-16 DIAGNOSIS — R921 Mammographic calcification found on diagnostic imaging of breast: Secondary | ICD-10-CM

## 2019-01-01 DIAGNOSIS — F411 Generalized anxiety disorder: Secondary | ICD-10-CM | POA: Diagnosis not present

## 2019-01-01 DIAGNOSIS — E782 Mixed hyperlipidemia: Secondary | ICD-10-CM | POA: Diagnosis not present

## 2019-01-01 DIAGNOSIS — R7301 Impaired fasting glucose: Secondary | ICD-10-CM | POA: Diagnosis not present

## 2019-02-11 DIAGNOSIS — E782 Mixed hyperlipidemia: Secondary | ICD-10-CM | POA: Diagnosis not present

## 2019-02-11 DIAGNOSIS — R7301 Impaired fasting glucose: Secondary | ICD-10-CM | POA: Diagnosis not present

## 2019-02-16 ENCOUNTER — Other Ambulatory Visit: Payer: Self-pay | Admitting: Internal Medicine

## 2019-02-16 DIAGNOSIS — Z780 Asymptomatic menopausal state: Secondary | ICD-10-CM

## 2019-02-16 DIAGNOSIS — F411 Generalized anxiety disorder: Secondary | ICD-10-CM | POA: Diagnosis not present

## 2019-02-16 DIAGNOSIS — E782 Mixed hyperlipidemia: Secondary | ICD-10-CM | POA: Diagnosis not present

## 2019-02-16 DIAGNOSIS — Z0000 Encounter for general adult medical examination without abnormal findings: Secondary | ICD-10-CM | POA: Diagnosis not present

## 2019-02-16 DIAGNOSIS — R7303 Prediabetes: Secondary | ICD-10-CM | POA: Diagnosis not present

## 2019-03-31 ENCOUNTER — Encounter (HOSPITAL_COMMUNITY): Payer: Self-pay

## 2019-03-31 ENCOUNTER — Emergency Department (HOSPITAL_COMMUNITY): Payer: BC Managed Care – PPO

## 2019-03-31 ENCOUNTER — Emergency Department (HOSPITAL_COMMUNITY)
Admission: EM | Admit: 2019-03-31 | Discharge: 2019-03-31 | Disposition: A | Discharge status: Home / Self Care | Payer: BC Managed Care – PPO | Attending: Emergency Medicine | Admitting: Emergency Medicine

## 2019-03-31 ENCOUNTER — Other Ambulatory Visit: Payer: Self-pay

## 2019-03-31 DIAGNOSIS — Z87442 Personal history of urinary calculi: Secondary | ICD-10-CM | POA: Diagnosis not present

## 2019-03-31 DIAGNOSIS — R109 Unspecified abdominal pain: Secondary | ICD-10-CM | POA: Diagnosis not present

## 2019-03-31 DIAGNOSIS — Z9089 Acquired absence of other organs: Secondary | ICD-10-CM | POA: Diagnosis not present

## 2019-03-31 DIAGNOSIS — E119 Type 2 diabetes mellitus without complications: Secondary | ICD-10-CM | POA: Diagnosis not present

## 2019-03-31 DIAGNOSIS — K8020 Calculus of gallbladder without cholecystitis without obstruction: Secondary | ICD-10-CM | POA: Diagnosis not present

## 2019-03-31 DIAGNOSIS — N201 Calculus of ureter: Secondary | ICD-10-CM | POA: Diagnosis not present

## 2019-03-31 DIAGNOSIS — N132 Hydronephrosis with renal and ureteral calculous obstruction: Secondary | ICD-10-CM | POA: Insufficient documentation

## 2019-03-31 DIAGNOSIS — Z79899 Other long term (current) drug therapy: Secondary | ICD-10-CM | POA: Diagnosis not present

## 2019-03-31 DIAGNOSIS — Z7984 Long term (current) use of oral hypoglycemic drugs: Secondary | ICD-10-CM | POA: Insufficient documentation

## 2019-03-31 HISTORY — DX: Disorder of kidney and ureter, unspecified: N28.9

## 2019-03-31 LAB — BASIC METABOLIC PANEL
Anion gap: 12 (ref 5–15)
BUN: 23 mg/dL (ref 8–23)
CO2: 22 mmol/L (ref 22–32)
Calcium: 9.1 mg/dL (ref 8.9–10.3)
Chloride: 102 mmol/L (ref 98–111)
Creatinine, Ser: 1.02 mg/dL — ABNORMAL HIGH (ref 0.44–1.00)
GFR calc Af Amer: >60 mL/min (ref >60)
GFR calc non Af Amer: 58 mL/min — ABNORMAL LOW (ref >60)
Glucose, Bld: 233 mg/dL — ABNORMAL HIGH (ref 70–99)
Potassium: 3.9 mmol/L (ref 3.5–5.1)
Sodium: 136 mmol/L (ref 135–145)

## 2019-03-31 LAB — URINALYSIS, ROUTINE W REFLEX MICROSCOPIC
Bacteria, UA: NONE SEEN
Bilirubin Urine: NEGATIVE
Glucose, UA: NEGATIVE mg/dL
Hgb urine dipstick: NEGATIVE
Ketones, ur: 5 mg/dL — AB
Nitrite: NEGATIVE
Protein, ur: NEGATIVE mg/dL
Specific Gravity, Urine: 1.024 (ref 1.005–1.030)
pH: 6 (ref 5.0–8.0)

## 2019-03-31 LAB — CBC WITH DIFFERENTIAL/PLATELET
Abs Immature Granulocytes: 0.04 10*3/uL (ref 0.00–0.07)
Basophils Absolute: 0 10*3/uL (ref 0.0–0.1)
Basophils Relative: 0 %
Eosinophils Absolute: 0.1 10*3/uL (ref 0.0–0.5)
Eosinophils Relative: 1 %
HCT: 40.9 % (ref 36.0–46.0)
Hemoglobin: 13.1 g/dL (ref 12.0–15.0)
Immature Granulocytes: 0 %
Lymphocytes Relative: 15 %
Lymphs Abs: 1.4 10*3/uL (ref 0.7–4.0)
MCH: 27.9 pg (ref 26.0–34.0)
MCHC: 32 g/dL (ref 30.0–36.0)
MCV: 87 fL (ref 80.0–100.0)
Monocytes Absolute: 0.4 10*3/uL (ref 0.1–1.0)
Monocytes Relative: 4 %
Neutro Abs: 7.6 10*3/uL (ref 1.7–7.7)
Neutrophils Relative %: 80 %
Platelets: 176 10*3/uL (ref 150–400)
RBC: 4.7 MIL/uL (ref 3.87–5.11)
RDW: 12.9 % (ref 11.5–15.5)
WBC: 9.5 10*3/uL (ref 4.0–10.5)
nRBC: 0 % (ref 0.0–0.2)

## 2019-03-31 MED ORDER — TAMSULOSIN HCL 0.4 MG PO CAPS: 0.4000 mg | ORAL_CAPSULE | Freq: Every day | ORAL | 0 refills | Status: DC

## 2019-03-31 MED ORDER — ONDANSETRON HCL 4 MG PO TABS: 4.0000 mg | ORAL_TABLET | Freq: Four times a day (QID) | ORAL | 0 refills | Status: AC

## 2019-03-31 MED ORDER — KETOROLAC TROMETHAMINE 30 MG/ML IJ SOLN
30.0000 mg | Freq: Once | INTRAMUSCULAR | Status: AC
  Administered 2019-03-31: 30 mg via INTRAVENOUS
  Filled 2019-03-31: qty 1

## 2019-03-31 MED ORDER — ONDANSETRON HCL 4 MG/2ML IJ SOLN
4.0000 mg | Freq: Once | INTRAMUSCULAR | Status: AC
  Administered 2019-03-31: 4 mg via INTRAVENOUS
  Filled 2019-03-31: qty 2

## 2019-03-31 MED ORDER — HYDROMORPHONE HCL 1 MG/ML IJ SOLN
1.0000 mg | Freq: Once | INTRAMUSCULAR | Status: AC
  Administered 2019-03-31: 1 mg via INTRAVENOUS
  Filled 2019-03-31: qty 1

## 2019-03-31 MED ORDER — OXYCODONE-ACETAMINOPHEN 5-325 MG PO TABS: 1.0000 | ORAL_TABLET | ORAL | 0 refills | Status: DC | PRN

## 2019-03-31 NOTE — ED Provider Notes (Signed)
[facility] EMERGENCY DEPARTMENT Provider Note   CSN: [id number] Arrival date & time: 03/31/19  1052     History   Chief Complaint Chief Complaint  Patient presents with  . Flank Pain    HPI Margaret Cantu is a 63 y.o. female.     HPI   Margaret Cantu is a 63 y.o. female who presents to the Emergency Department complaining of left flank pain of sudden onset approximately 1 hour ago.  Symptoms have been associated with nausea and vomiting.  She describes a sharp pain to her left flank that radiates into her side.  She reports a history of kidney stones and states her current pain feels similar to previous stones.  She denies fever, chest pain, cough, shortness of breath, and dysuria.  No hematuria.   Past Medical History:  Diagnosis Date  . Anxiety   . Diabetes mellitus without complication (HCC)    pre-diabetes  . Gallstones   . Hyperlipidemia   . Migraine   . Renal disorder    kidney stones    Patient Active Problem List   Diagnosis Date Noted  . Encounter for gynecological examination with Papanicolaou smear of cervix 07/30/2016    Past Surgical History:  Procedure Laterality Date  . APPENDECTOMY    . LITHOTRIPSY       OB History    Gravida  4   Para  4   Term  3   Preterm  1   AB      Living  4     SAB      TAB      Ectopic      Multiple      Live Births  4            Home Medications    Prior to Admission medications   Medication Sig Start Date End Date Taking? Authorizing Provider  ALPRAZolam Prudy Feeler(XANAX) 1 MG tablet Take 1 mg by mouth at bedtime as needed for anxiety.    [provider]  doxycycline (VIBRAMYCIN) 100 MG capsule Take 1 capsule (100 mg total) by mouth 2 (two) times daily. 04/14/18   Burgess AmorIdol, Julie, PA-C  ibuprofen (ADVIL,MOTRIN) 800 MG tablet Take 1 tablet (800 mg total) by mouth 3 (three) times daily. 04/14/18   Burgess AmorIdol, Julie, PA-C  metFORMIN (GLUCOPHAGE) 500 MG tablet TAKE ONE TABLET BY MOUTH ONCE DAILY  07/24/16   [provider]  mometasone (ELOCON) 0.1 % ointment Apply topically daily. Patient taking differently: Apply 1 application topically daily.  07/30/16   Adline PotterGriffin, Jennifer A, NP  oxyCODONE-acetaminophen (PERCOCET/ROXICET) 5-325 MG tablet TAKE 1 TABLET BY MOUTH TWICE DAILY PRF PAIN 06/10/17   [provider]  simvastatin (ZOCOR) 10 MG tablet TAKE ONE TABLET BY MOUTH ONCE DAILY AT BEDTIME 06/27/16   [provider]    Family History Family History  Problem Relation Age of Onset  . Heart attack Maternal Grandmother   . Heart attack Father   . Alcohol abuse Father   . Other Mother        septic shock  . COPD Brother   . Other Sister        MVA  . COPD Brother     Social History Social History   Tobacco Use  . Smoking status: Never Smoker  . Smokeless tobacco: Never Used  Substance Use Topics  . Alcohol use: Yes    Comment: every 6 months  . Drug use: No  Allergies   Patient has no known allergies.   Review of Systems Review of Systems  Constitutional: Negative for activity change, appetite change, chills and fever.  Respiratory: Negative for chest tightness and shortness of breath.   Cardiovascular: Negative for chest pain.  Gastrointestinal: Positive for nausea and vomiting. Negative for abdominal pain.  Genitourinary: Positive for flank pain. Negative for decreased urine volume, difficulty urinating, dysuria, frequency, hematuria and urgency.  Musculoskeletal: Negative for back pain.  Skin: Negative for rash.  Neurological: Negative for dizziness, weakness and numbness.  Hematological: Negative for adenopathy.  Psychiatric/Behavioral: Negative for confusion.     Physical Exam Updated Vital Signs BP (!) 177/103 (BP Location: Left Arm)   Pulse 84   Temp 97.6 F (36.4 C) (Oral)   Resp 20   Ht 5\' 2"  (1.575 m)   Wt 61.2 kg   SpO2 98%   BMI 24.69 kg/m   Physical Exam Vitals signs and nursing note reviewed.   Constitutional:      General: She is not in acute distress.    Appearance: Normal appearance. She is well-developed.     Comments: Patient is uncomfortable appearing, nontoxic  HENT:     Head: Atraumatic.     Mouth/Throat:     Mouth: Mucous membranes are moist.  Cardiovascular:     Rate and Rhythm: Normal rate and regular rhythm.     Pulses: Normal pulses.  Pulmonary:     Effort: Pulmonary effort is normal. No respiratory distress.     Breath sounds: Normal breath sounds. No wheezing or rales.  Abdominal:     General: There is no distension.     Palpations: Abdomen is soft. Abdomen is not rigid. There is no mass.     Tenderness: There is no abdominal tenderness. There is no right CVA tenderness, left CVA tenderness, guarding or rebound. Negative signs include McBurney's sign.     Comments: No CVA tenderness  Musculoskeletal: Normal range of motion.  Skin:    General: Skin is warm and dry.     Capillary Refill: Capillary refill takes less than 2 seconds.     Findings: No rash.  Neurological:     General: No focal deficit present.     Mental Status: She is alert.     Sensory: No sensory deficit.     Motor: No weakness.      ED Treatments / Results  Labs (all labs ordered are listed, but only abnormal results are displayed) Labs Reviewed  BASIC METABOLIC PANEL - Abnormal; Notable for the following components:      Result Value   Glucose, Bld 233 (*)    Creatinine, Ser 1.02 (*)    GFR calc non Af Amer 58 (*)    All other components within normal limits  URINALYSIS, ROUTINE W REFLEX MICROSCOPIC - Abnormal; Notable for the following components:   APPearance HAZY (*)    Ketones, ur 5 (*)    Leukocytes,Ua TRACE (*)    All other components within normal limits  CBC WITH DIFFERENTIAL/PLATELET    EKG None  Radiology Ct Renal Stone Study  Result Date: 03/31/2019 CLINICAL DATA:  Left flank pain for 1 hour. EXAM: CT ABDOMEN AND PELVIS WITHOUT CONTRAST TECHNIQUE:  Multidetector CT imaging of the abdomen and pelvis was performed following the standard protocol without IV contrast. COMPARISON:  October 08, 2006 FINDINGS: Lower chest: No acute abnormality. Hepatobiliary: The liver is normal without focal liver lesion. Gallstones are noted in the gallbladder. There is no  inflammation around the gallbladder. The biliary tree is normal. Pancreas: Unremarkable. No pancreatic ductal dilatation or surrounding inflammatory changes. Spleen: Normal in size without focal abnormality. Adrenals/Urinary Tract: The bilateral adrenal glands are normal. There is left hydronephrosis due to obstruction by several stones in the distal left ureter, largest measures 5 mm in the distal left ureter prior to ureteral vesicular junction. Nonobstructing stones are identified in the left kidney. The right kidney is normal. Bladder is normal. Stomach/Bowel: Stomach is within normal limits. Patient status post prior appendectomy. No evidence of bowel wall thickening, distention, or inflammatory changes. Vascular/Lymphatic: Aortic atherosclerosis. No enlarged abdominal or pelvic lymph nodes. Reproductive: Uterus and bilateral adnexa are unremarkable. Other: None Musculoskeletal: Degenerative joint changes of lower thoracic spine are noted. IMPRESSION: Left hydronephrosis due to obstruction by several stones in the distal left ureter, largest measures 5 mm in the distal left ureter prior to the ureteral vesicular junction. Gallstones within the gallbladder. No inflammation identified around gallbladder. Prior appendectomy. Electronically Signed   By: Abelardo Diesel M.D.   On: 03/31/2019 12:16    Procedures Procedures (including critical care time)  Medications Ordered in ED Medications  ondansetron (ZOFRAN) injection 4 mg (has no administration in time range)  HYDROmorphone (DILAUDID) injection 1 mg (has no administration in time range)     Initial Impression / Assessment and Plan / ED Course  I  have reviewed the triage vital signs and the nursing notes.  Pertinent labs & imaging results that were available during my care of the patient were reviewed by me and considered in my medical decision making (see chart for details).        Patient here with sudden onset left flank pain and history of previous kidney stones.  Will obtain IVs and CT renal stone study.  CT shows left hydronephrosis with multiple ureteral stones of the distal ureter.  Patient has been pain-free since IV Toradol.  Vital signs reviewed.  She is nontoxic-appearing.  Given that patient has multiple stones in the ureter and the largest is 5 mm in size, I will consult urology.  [place] who agrees to see pt in his office.  I will rx pain medication, flomax and anti-emetic.    Final Clinical Impressions(s) / ED Diagnoses   Final diagnoses:  Ureterolithiasis    ED Discharge Orders    None       Kem Parkinson, PA-C 03/31/19 1617    Milton Ferguson, MD 03/31/19 1818

## 2019-03-31 NOTE — ED Notes (Signed)
Spoke with pt's son Doy Hutching

## 2019-03-31 NOTE — ED Triage Notes (Signed)
Pt c/o left flank pain and pain in left side for the past hour.  Reports nauseated.  No vomiting or diarrhea.  Denies fever, cough, or sob.  Reports history of kidney stones and had lithotripsy procedure.

## 2019-03-31 NOTE — Discharge Instructions (Addendum)
Your CT scan today shows that you have several kidney stones on the left side.  You need to follow-up with urology.  You may call [facility] listed to arrange a follow-up appointment.  Return to the ER for any worsening symptoms such as increasing pain, fever, or vomiting.

## 2019-04-05 ENCOUNTER — Telehealth: Payer: Self-pay | Admitting: Adult Health

## 2019-04-05 NOTE — Telephone Encounter (Signed)
Pt would like an appt to see Margaret Cantu for a rash she is having.

## 2019-04-05 NOTE — Telephone Encounter (Signed)
Mail box full @ 12:42 pm. JSY

## 2019-04-06 ENCOUNTER — Telehealth: Payer: Self-pay | Admitting: Adult Health

## 2019-04-06 NOTE — Telephone Encounter (Signed)
Pt has a vaginal rash. + itching that goes into rectum. Prep H has helped. Pt wants an appt. Call transferred to front desk for appt. [place]

## 2019-04-06 NOTE — Telephone Encounter (Signed)

## 2019-04-07 ENCOUNTER — Encounter: Payer: Self-pay | Admitting: Adult Health

## 2019-04-07 ENCOUNTER — Other Ambulatory Visit: Payer: Self-pay

## 2019-04-07 ENCOUNTER — Ambulatory Visit (INDEPENDENT_AMBULATORY_CARE_PROVIDER_SITE_OTHER): Payer: BC Managed Care – PPO | Admitting: Adult Health

## 2019-04-07 VITALS — BP 138/95 | HR 84 | Ht 62.0 in | Wt 137.0 lb

## 2019-04-07 DIAGNOSIS — K649 Unspecified hemorrhoids: Secondary | ICD-10-CM | POA: Diagnosis not present

## 2019-04-07 DIAGNOSIS — L900 Lichen sclerosus et atrophicus: Secondary | ICD-10-CM | POA: Insufficient documentation

## 2019-04-07 MED ORDER — PRAMOXINE HCL (PERIANAL) 1 % EX FOAM: 1.0000 "application " | Freq: Three times a day (TID) | CUTANEOUS | 2 refills | Status: DC | PRN

## 2019-04-07 MED ORDER — CLOBETASOL PROPIONATE 0.05 % EX OINT: TOPICAL_OINTMENT | CUTANEOUS | 3 refills | Status: DC

## 2019-04-07 NOTE — Progress Notes (Signed)
Patient ID: Margaret Cantu, female   DOB: 1956-05-15, 63 y.o.   MRN: [id number] History of Present Illness:  Margaret Cantu is a 63 year old white female, PM , widowed, in complaining of vaginal itching extending to rectum for a bout a month now.Was seen in ER 7/22 and has kidney stones. PCP is Dr Nevada Crane.  Current Medications, Allergies, Past Medical History, Past Surgical History, Family History and Social History were reviewed in [employer] record.     Review of Systems: Vaginal itching extending to rectum Has kidney stones     Physical Exam:BP (!) 138/95 (BP Location: Left Arm, Patient Position: Sitting, Cuff Size: Normal)   Pulse 84   Ht 5\' 2"  (1.575 m)   Wt 137 lb (62.1 kg)   BMI 25.06 kg/m  General:  Well developed, well nourished, no acute distress Skin:  Warm and dry Pelvic:  External genitalia is thin and slightly red, with some pale areas at introitus.  The vagina is atrophic. Urethra has no lesions or masses. The cervix is smooth.  Uterus is felt to be normal size, shape, and contour.  No adnexal masses or tenderness noted.Bladder is non tender, no masses felt. Rectal: Good sphincter tone, no polyps,+ hemorrhoids felt.   Psych:  No mood changes, alert and cooperative,seems happy Fall risk is low. Examination chaperoned by Estill Bamberg Rash LPN. Discussed that LSA is chronic condition.  She says she had pap at PCP.  Impression: 1. Lichen sclerosus et atrophicus   2. Hemorrhoids, unspecified hemorrhoid type       Plan: Will rx temovate for LSA and showed her pictures on genital dermatology and gave handout on LSA Will rx proctofoam for hemorrhoids or use preparation H Meds ordered this encounter  Medications  . clobetasol ointment (TEMOVATE) 0.05 %    Sig: Take bid to affected area for 2 weeks then 2-3 x wekly    Dispense:  45 g    Refill:  3    Order Specific Question:   Supervising Provider    Answer:   EURE, LUTHER H [[id number]]  . pramoxine  (PROCTOFOAM) 1 % foam    Sig: Place 1 application rectally 3 (three) times daily as needed for anal itching.    Dispense:  15 g    Refill:  2    Order Specific Question:   Supervising Provider    Answer:   Tania Ade H [[id number]]  Follow up in 3 months

## 2019-04-07 NOTE — Patient Instructions (Signed)
Lichen Sclerosus Lichen sclerosus is a skin problem. It can happen on any part of the body, but it commonly involves the anal or genital areas. It can cause itching and discomfort in these areas. Treatment can help to control symptoms. When the genital area is affected, getting treatment is important because the condition can cause scarring that may lead to other problems. What are the causes? The cause of this condition is not known. It may be related to an overactive immune system or a lack of certain hormones. Lichen sclerosus is not an infection or a fungus, and it is not passed from one person to another (not contagious). What increases the risk? This condition is more likely to develop in women, usually after menopause. What are the signs or symptoms? Symptoms of this condition include:  Thin, wrinkled, white areas on the skin.  Thickened white areas on the skin.  Red and swollen patches (lesions) on the skin.  Tears or cracks in the skin.  Bruising.  Blood blisters.  Severe itching.  Pain, itching, or burning when urinating. Constipation is also common in people with lichen sclerosus. How is this diagnosed? This condition may be diagnosed with a physical exam. In some cases, a tissue sample (biopsy sample) may be removed to be looked at under a microscope. How is this treated? This condition is usually treated with medicated creams or ointments (topical steroids) that are applied over the affected areas. In some cases, treatment may also include medicines that are taken by mouth. Surgery may be needed in more severe cases that are causing problems such as scarring. Follow these instructions at home:  Take or use over-the-counter and prescription medicines only as told by your health care provider.  Use creams or ointments as told by your health care provider.  Do not scratch the affected areas of skin.  If you are a woman, be sure to keep the vaginal area as clean and dry  as possible.  Clean the affected area of skin gently with water. Avoid using rough towels or toilet paper.  Keep all follow-up visits as told by your health care provider. This is important. Contact a health care provider if:  You have increasing redness, swelling, or pain in the affected area.  You have fluid, blood, or pus coming from the affected area.  You have new lesions on your skin.  You have a fever.  You have pain during sex. Summary  Lichen sclerosus is a skin problem. When the genital area is affected, getting treatment is important because the condition can cause scarring that may lead to other problems.  This condition is usually treated with medicated creams or ointments (topical steroids) that are applied over the affected areas.  Take or use over-the-counter and prescription medicines only as told by your health care provider.  Contact a health care provider if you have new lesions on your skin, have pain during sex, or have increasing redness, swelling, or pain in the affected area.  Keep all follow-up visits as told by your health care provider. This is important. This information is not intended to replace advice given to you by your health care provider. Make sure you discuss any questions you have with your health care provider. Document Released: 01/16/2011 Document Revised: 01/08/2018 Document Reviewed: 01/08/2018 Elsevier Patient Education  2020 Elsevier Inc.  

## 2019-04-08 DIAGNOSIS — N202 Calculus of kidney with calculus of ureter: Secondary | ICD-10-CM | POA: Diagnosis not present

## 2019-05-10 DIAGNOSIS — N202 Calculus of kidney with calculus of ureter: Secondary | ICD-10-CM | POA: Diagnosis not present

## 2019-07-05 DIAGNOSIS — L409 Psoriasis, unspecified: Secondary | ICD-10-CM | POA: Diagnosis not present

## 2019-07-05 DIAGNOSIS — Z6824 Body mass index (BMI) 24.0-24.9, adult: Secondary | ICD-10-CM | POA: Diagnosis not present

## 2019-07-05 DIAGNOSIS — Z0000 Encounter for general adult medical examination without abnormal findings: Secondary | ICD-10-CM | POA: Diagnosis not present

## 2019-07-05 DIAGNOSIS — E782 Mixed hyperlipidemia: Secondary | ICD-10-CM | POA: Diagnosis not present

## 2019-07-05 DIAGNOSIS — R7301 Impaired fasting glucose: Secondary | ICD-10-CM | POA: Diagnosis not present

## 2019-07-07 ENCOUNTER — Telehealth: Payer: Self-pay | Admitting: Adult Health

## 2019-07-07 NOTE — Telephone Encounter (Signed)

## 2019-07-08 ENCOUNTER — Ambulatory Visit (INDEPENDENT_AMBULATORY_CARE_PROVIDER_SITE_OTHER): Payer: BC Managed Care – PPO | Admitting: Adult Health

## 2019-07-08 ENCOUNTER — Other Ambulatory Visit: Payer: Self-pay

## 2019-07-08 ENCOUNTER — Encounter: Payer: Self-pay | Admitting: Adult Health

## 2019-07-08 VITALS — BP 143/90 | HR 111 | Ht 62.0 in | Wt 127.0 lb

## 2019-07-08 DIAGNOSIS — L900 Lichen sclerosus et atrophicus: Secondary | ICD-10-CM

## 2019-07-08 NOTE — Progress Notes (Signed)
  Subjective:     Patient ID: Margaret Cantu, female   DOB: Dec 12, 1955, 63 y.o.   MRN: [id number]  HPI Margaret Cantu is a 63 year old white female, widowed,PM back in follow up on LSA.  Itching has resolved  PCP is Dr Nevada Crane.  Review of Systems No itching now Passed kidney stone and has had sex(first time in 10 years), and has noticed red area Reviewed past medical,surgical, social and family history. Reviewed medications and allergies.     Objective:   Physical Exam BP (!) 143/90 (BP Location: Left Arm, Patient Position: Sitting, Cuff Size: Normal)   Pulse (!) 111   Ht 5\' 2"  (1.575 m)   Wt 127 lb (57.6 kg)   BMI 23.23 kg/m   Skin warm and dry.Pelvic: external genitalia is normal in appearance, no redness or pale areas today, no lesions, vagina: has los of color, moisture and rugae,urethra has redness at 9 o'clock, no masses noted, cervix:smooth, uterus: normal size, shape and contour, non tender, no masses felt, adnexa: no masses or tenderness noted. Bladder is non tender and no masses felt.   Pt gave permission for exam  Without chaperone.   Assessment:     LSA    Plan:     Continue temovate 2 x weekly Use astroglide with sex Follow up in 3 months

## 2019-07-14 DIAGNOSIS — R7303 Prediabetes: Secondary | ICD-10-CM | POA: Diagnosis not present

## 2019-07-14 DIAGNOSIS — Z0000 Encounter for general adult medical examination without abnormal findings: Secondary | ICD-10-CM | POA: Diagnosis not present

## 2019-07-14 DIAGNOSIS — E782 Mixed hyperlipidemia: Secondary | ICD-10-CM | POA: Diagnosis not present

## 2019-07-14 DIAGNOSIS — F411 Generalized anxiety disorder: Secondary | ICD-10-CM | POA: Diagnosis not present

## 2019-10-07 ENCOUNTER — Telehealth: Payer: Self-pay | Admitting: Adult Health

## 2019-10-07 NOTE — Telephone Encounter (Signed)

## 2019-10-08 ENCOUNTER — Ambulatory Visit (INDEPENDENT_AMBULATORY_CARE_PROVIDER_SITE_OTHER): Payer: 59 | Admitting: Adult Health

## 2019-10-08 ENCOUNTER — Other Ambulatory Visit: Payer: Self-pay

## 2019-10-08 ENCOUNTER — Encounter: Payer: Self-pay | Admitting: Adult Health

## 2019-10-08 VITALS — BP 143/95 | HR 79 | Ht 62.0 in | Wt 127.0 lb

## 2019-10-08 DIAGNOSIS — L900 Lichen sclerosus et atrophicus: Secondary | ICD-10-CM | POA: Diagnosis not present

## 2019-10-08 NOTE — Progress Notes (Signed)
  Subjective:     Patient ID: Margaret Cantu, female   DOB: 1956/02/07, 64 y.o.   MRN: [id number]  HPI Margaret Cantu is a 64 year old white female, widowed, PM back in follow up on vaginal itching due to [facility] and is better, only has itching once in a while.  PCP is Dr Margo Aye  Review of Systems Only has itching once in a while now Is having sex now   Reviewed past medical,surgical, social and family history. Reviewed medications and allergies.     Objective:   Physical Exam BP (!) 143/95 (BP Location: Left Arm, Patient Position: Sitting, Cuff Size: Normal)   Pulse 79   Ht 5\' 2"  (1.575 m)   Wt 127 lb (57.6 kg)   BMI 23.23 kg/m   Skin warm and dry.Pelvic: external genitalia is normal in appearance no lesions, vagina is pale with loss of moisture and rugae.,urethra has no lesions or masses noted, cervix:smooth, uterus: normal size, shape and contour, non tender, no masses felt, adnexa: no masses or tenderness noted. Bladder is non tender and no masses felt. Examination chaperoned by Rash LPN    Assessment:     1. Lichen sclerosus et atrophicus Continue temovate 2-3 x weekly    Plan:     Return in about 4 weeks for pap and physical

## 2019-10-26 ENCOUNTER — Other Ambulatory Visit: Payer: Self-pay | Admitting: Internal Medicine

## 2019-10-26 DIAGNOSIS — Z1231 Encounter for screening mammogram for malignant neoplasm of breast: Secondary | ICD-10-CM

## 2019-11-04 ENCOUNTER — Telehealth: Payer: Self-pay | Admitting: Adult Health

## 2019-11-04 NOTE — Telephone Encounter (Signed)

## 2019-11-08 ENCOUNTER — Ambulatory Visit (INDEPENDENT_AMBULATORY_CARE_PROVIDER_SITE_OTHER): Payer: 59 | Admitting: Adult Health

## 2019-11-08 ENCOUNTER — Encounter: Payer: Self-pay | Admitting: Adult Health

## 2019-11-08 ENCOUNTER — Ambulatory Visit (HOSPITAL_COMMUNITY)
Admission: RE | Admit: 2019-11-08 | Discharge: 2019-11-08 | Disposition: A | Discharge status: Home / Self Care | Payer: 59 | Source: Ambulatory Visit | Attending: Internal Medicine | Admitting: Internal Medicine

## 2019-11-08 ENCOUNTER — Other Ambulatory Visit: Payer: Self-pay

## 2019-11-08 ENCOUNTER — Other Ambulatory Visit (HOSPITAL_COMMUNITY)
Admission: RE | Admit: 2019-11-08 | Discharge: 2019-11-08 | Disposition: A | Discharge status: Home / Self Care | Payer: 59 | Source: Ambulatory Visit | Attending: Adult Health | Admitting: Adult Health

## 2019-11-08 VITALS — BP 138/91 | HR 66 | Ht 60.5 in | Wt 128.0 lb

## 2019-11-08 DIAGNOSIS — Z1231 Encounter for screening mammogram for malignant neoplasm of breast: Secondary | ICD-10-CM | POA: Diagnosis present

## 2019-11-08 DIAGNOSIS — Z01419 Encounter for gynecological examination (general) (routine) without abnormal findings: Secondary | ICD-10-CM

## 2019-11-08 DIAGNOSIS — Z1211 Encounter for screening for malignant neoplasm of colon: Secondary | ICD-10-CM | POA: Diagnosis not present

## 2019-11-08 DIAGNOSIS — K649 Unspecified hemorrhoids: Secondary | ICD-10-CM

## 2019-11-08 DIAGNOSIS — Z1212 Encounter for screening for malignant neoplasm of rectum: Secondary | ICD-10-CM | POA: Diagnosis not present

## 2019-11-08 DIAGNOSIS — L900 Lichen sclerosus et atrophicus: Secondary | ICD-10-CM

## 2019-11-08 LAB — HEMOCCULT GUIAC POC 1CARD (OFFICE): Fecal Occult Blood, POC: NEGATIVE

## 2019-11-08 NOTE — Progress Notes (Signed)
Patient ID: Margaret Cantu, female   DOB: 1956/05/10, 64 y.o.   MRN: [id number] History of Present Illness:  Margaret Cantu is a 64 year old white female, widowed, PM in for a well woman gyn exam and pap. PCP is Dr Margo Aye.  Current Medications, Allergies, Past Medical History, Past Surgical History, Family History and Social History were reviewed in [employer] record.     Review of Systems: Patient denies any headaches, hearing loss, fatigue, blurred vision, shortness of breath, chest pain, abdominal pain, problems with bowel movements, urination, or intercourse. No joint pain or mood swings.    Physical Exam:BP (!) 138/91 (BP Location: Left Arm, Patient Position: Sitting, Cuff Size: Normal)   Pulse 66   Ht 5' 0.5" (1.537 m)   Wt 128 lb (58.1 kg)   BMI 24.59 kg/m  General:  Well developed, well nourished, no acute distress Skin:  Warm and dry Neck:  Midline trachea, normal thyroid, good ROM, no lymphadenopathy,no carotid bruits heard Lungs; Clear to auscultation bilaterally Breast:  No dominant palpable mass, retraction, or nipple discharge Cardiovascular: Regular rate and rhythm Abdomen:  Soft, non tender, no hepatosplenomegaly Pelvic:  External genitalia is normal in appearance, no lesions.  The vagina is pale with loss of moisture and rugae. Urethra has no lesions or masses. The cervix is smooth,pap with high risk HPV 16/18 genotyping performed.  Uterus is felt to be normal size, shape, and contour.  No adnexal masses or tenderness noted.Bladder is non tender, no masses felt. Rectal: Good sphincter tone, no polyps, + hemorrhoids felt.  Hemoccult negative. Extremities/musculoskeletal:  No swelling or varicosities noted, no clubbing or cyanosis Psych:  No mood changes, alert and cooperative,seems happy Fall risk is low PHQ 2 score is 0. Examination chaperoned by [name] LPN  Impression and Plan:   1. Encounter for gynecological examination with  Papanicolaou smear of cervix Pap sent Physical in 1 year  Pap in 3 if normal Labs with PCP  2. Screening for colorectal cancer  3. Lichen sclerosus et atrophicus Has temovate  4. Hemorrhoids, unspecified hemorrhoid type

## 2019-11-10 LAB — CYTOLOGY - PAP
Comment: NEGATIVE
Diagnosis: NEGATIVE
High risk HPV: NEGATIVE

## 2020-03-27 DIAGNOSIS — G43009 Migraine without aura, not intractable, without status migrainosus: Secondary | ICD-10-CM | POA: Diagnosis not present

## 2020-03-27 DIAGNOSIS — R7301 Impaired fasting glucose: Secondary | ICD-10-CM | POA: Diagnosis not present

## 2020-03-27 DIAGNOSIS — I10 Essential (primary) hypertension: Secondary | ICD-10-CM | POA: Diagnosis not present

## 2020-03-27 DIAGNOSIS — L0231 Cutaneous abscess of buttock: Secondary | ICD-10-CM | POA: Diagnosis not present

## 2020-03-27 DIAGNOSIS — E782 Mixed hyperlipidemia: Secondary | ICD-10-CM | POA: Diagnosis not present

## 2020-03-27 DIAGNOSIS — R7303 Prediabetes: Secondary | ICD-10-CM | POA: Diagnosis not present

## 2020-03-28 DIAGNOSIS — E782 Mixed hyperlipidemia: Secondary | ICD-10-CM | POA: Diagnosis not present

## 2020-03-28 DIAGNOSIS — E118 Type 2 diabetes mellitus with unspecified complications: Secondary | ICD-10-CM | POA: Diagnosis not present

## 2020-03-28 DIAGNOSIS — R7303 Prediabetes: Secondary | ICD-10-CM | POA: Diagnosis not present

## 2020-03-28 DIAGNOSIS — L309 Dermatitis, unspecified: Secondary | ICD-10-CM | POA: Diagnosis not present

## 2020-05-01 ENCOUNTER — Other Ambulatory Visit (HOSPITAL_COMMUNITY): Payer: Self-pay | Admitting: Internal Medicine

## 2020-05-01 ENCOUNTER — Other Ambulatory Visit: Payer: Self-pay | Admitting: Internal Medicine

## 2020-05-01 DIAGNOSIS — R102 Pelvic and perineal pain: Secondary | ICD-10-CM

## 2020-05-04 ENCOUNTER — Ambulatory Visit: Payer: 59 | Admitting: Adult Health

## 2020-05-08 ENCOUNTER — Other Ambulatory Visit: Payer: Self-pay

## 2020-05-08 ENCOUNTER — Ambulatory Visit (HOSPITAL_COMMUNITY)
Admission: RE | Admit: 2020-05-08 | Discharge: 2020-05-08 | Disposition: A | Discharge status: Home / Self Care | Payer: Medicare Other | Source: Ambulatory Visit | Attending: Internal Medicine | Admitting: Internal Medicine

## 2020-05-08 DIAGNOSIS — Z780 Asymptomatic menopausal state: Secondary | ICD-10-CM | POA: Diagnosis not present

## 2020-05-08 DIAGNOSIS — R102 Pelvic and perineal pain: Secondary | ICD-10-CM | POA: Insufficient documentation

## 2020-05-10 ENCOUNTER — Ambulatory Visit: Payer: Medicare Other | Admitting: Adult Health

## 2020-05-11 ENCOUNTER — Ambulatory Visit: Payer: Medicare Other | Admitting: Adult Health

## 2020-05-16 ENCOUNTER — Encounter: Payer: Self-pay | Admitting: Adult Health

## 2020-05-16 ENCOUNTER — Ambulatory Visit (INDEPENDENT_AMBULATORY_CARE_PROVIDER_SITE_OTHER): Payer: Medicare Other | Admitting: Adult Health

## 2020-05-16 ENCOUNTER — Other Ambulatory Visit: Payer: Self-pay

## 2020-05-16 VITALS — BP 125/82 | HR 74 | Ht 61.0 in | Wt 128.0 lb

## 2020-05-16 DIAGNOSIS — R102 Pelvic and perineal pain: Secondary | ICD-10-CM | POA: Diagnosis not present

## 2020-05-16 LAB — POCT URINALYSIS DIPSTICK
Blood, UA: NEGATIVE
Glucose, UA: NEGATIVE
Leukocytes, UA: NEGATIVE
Nitrite, UA: NEGATIVE
Protein, UA: NEGATIVE

## 2020-05-16 NOTE — Progress Notes (Signed)
  Subjective:     Patient ID: Margaret Cantu, female   DOB: Jan 20, 1956, 64 y.o.   MRN: [id number]  HPI Margaret Cantu is a 64 year old white female, widowed, PM in complaining of pelvic pain on and off for 2 months,  like an ache. Had [place] 05/08/20 at [facility] that was normal. She had normal pap with negative HPV 11/08/19.  PCP is Dr Margo Aye  Review of Systems +pelvic pain on and off for 2 months, like an ache Denies any vaginal discharge,itching or nausea,vomiting, or diarrhea or constipation Denies any fever or problems with urination but urine smells strong  Not having sex   Reviewed past medical,surgical, social and family history. Reviewed medications and allergies.     Objective:   Physical Exam BP 125/82 (BP Location: Right Arm, Patient Position: Sitting, Cuff Size: Normal)   Pulse 74   Ht 5\' 1"  (1.549 m)   Wt 128 lb (58.1 kg)   BMI 24.19 kg/m urine dipstick is negative. Skin warm and dry.Pelvic: external genitalia is normal in appearance no lesions, vagina:pale with loss of moisture and rugae,urethra has no lesions or masses noted, cervix:smooth,no CMT, uterus: normal size, shape and contour, non tender, no masses felt, adnexa: no masses or tenderness noted. Bladder is non tender and no masses felt.  Reviewed with her, has small fibroid, ovaries normal, endometrium is 2 mm   Upstream - 05/16/20 1527      Pregnancy Intention Screening   Does the patient want to become pregnant in the next year? No    Does the patient's partner want to become pregnant in the next year? No    Would the patient like to discuss contraceptive options today? No      Contraception Wrap Up   Current Method No Method - Other Reason   PM   End Method No Method - Other Reason   PM   Contraception Counseling Provided No         Examination chaperoned by 07/16/20 CMA.    Assessment:     1. Pelvic pain Keep log of when it happens and if associated with anything like eating, or not  or BM      Plan:     Follow up prn

## 2020-05-16 NOTE — Addendum Note (Signed)
Addended by: Cyril Mourning A on: 05/16/2020 03:47 PM   Modules accepted: Orders

## 2020-08-27 IMAGING — MG DIGITAL SCREENING BILAT W/ TOMO W/ CAD
8 series · 9 of 24 positions shown · non-contrast
Comparison: Previous exam(s).

CLINICAL DATA: Screening.

EXAM:
DIGITAL SCREENING BILATERAL MAMMOGRAM WITH TOMO AND CAD

[R MLO synth-2D]
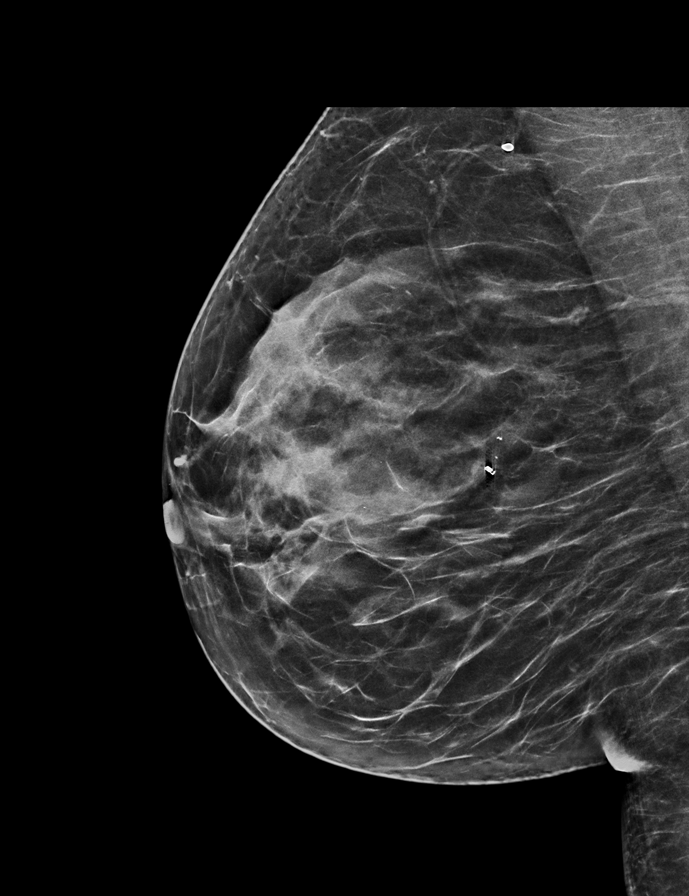

[L MLO synth-2D]
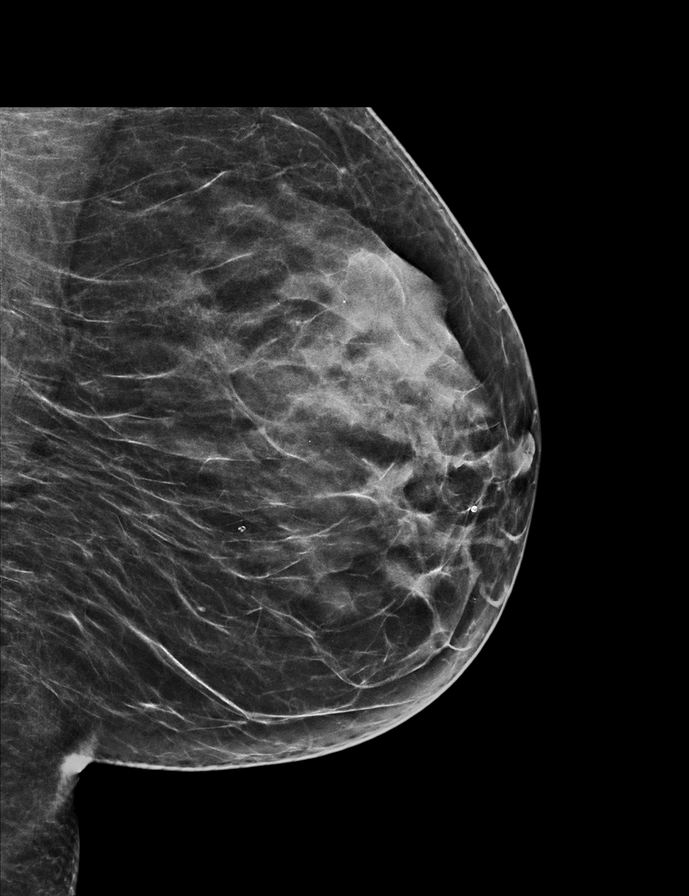

[L CC synth-2D]
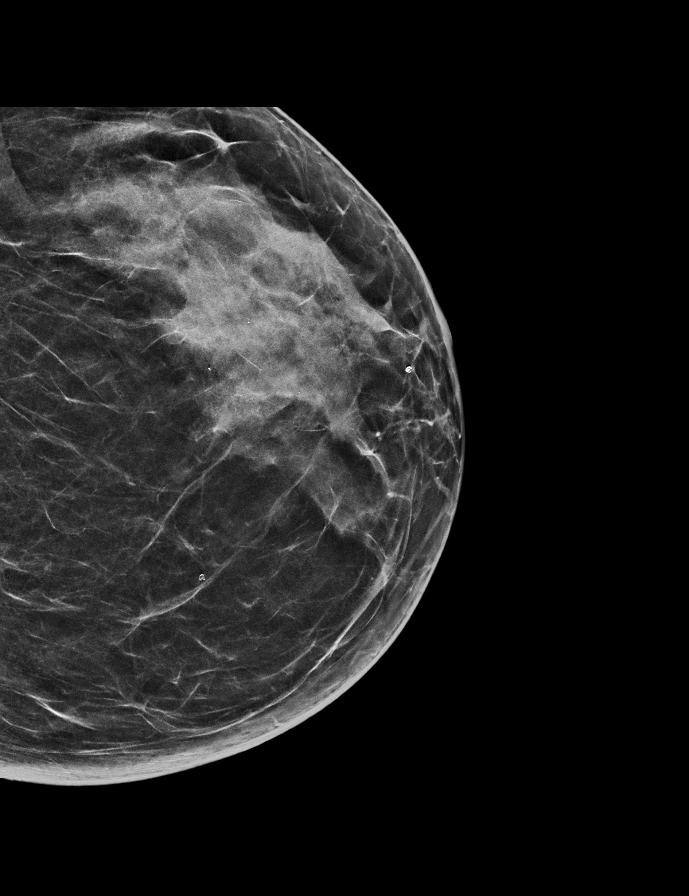

[R CC synth-2D]
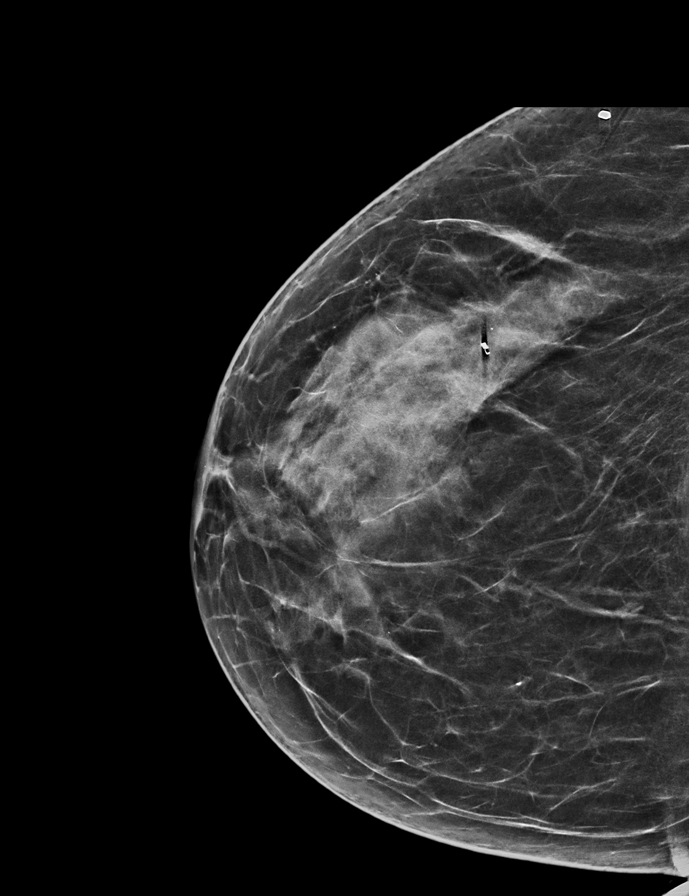

[R CC tomo · 2 of 58 frames shown]
[frame 19/58]
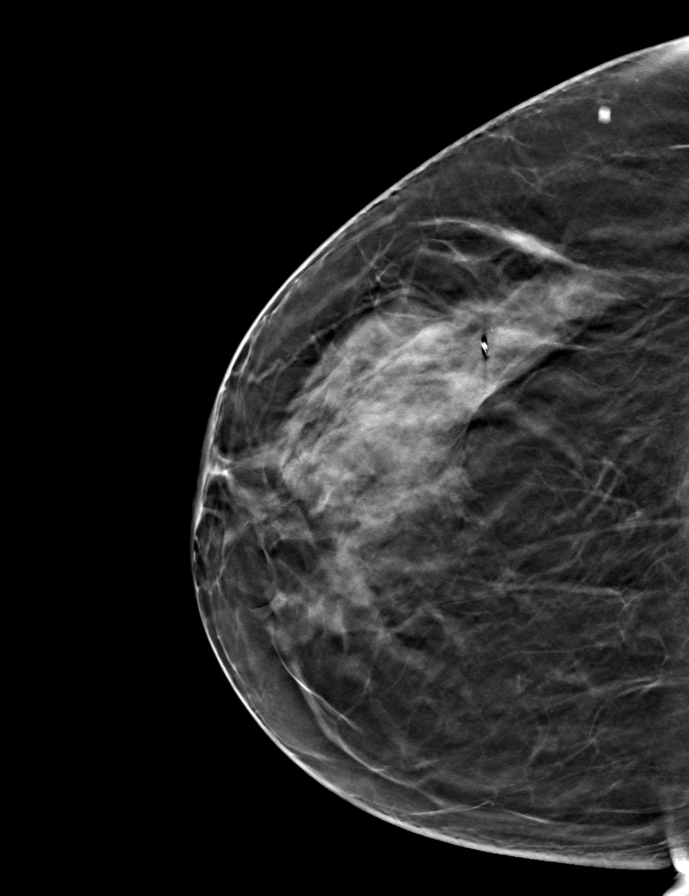
[frame 29/58]
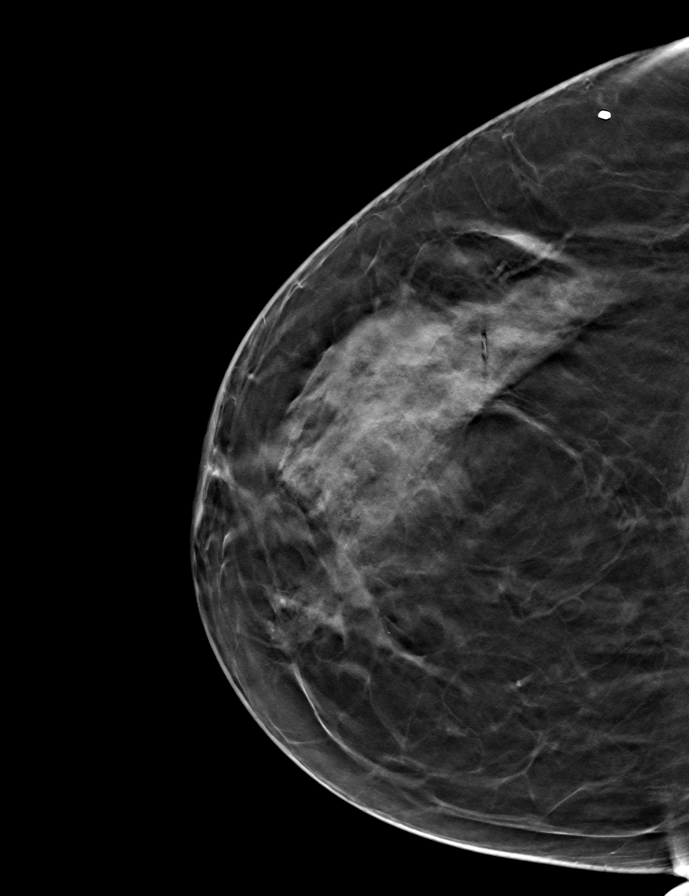

[R MLO tomo · tomo slice 31/62.0]
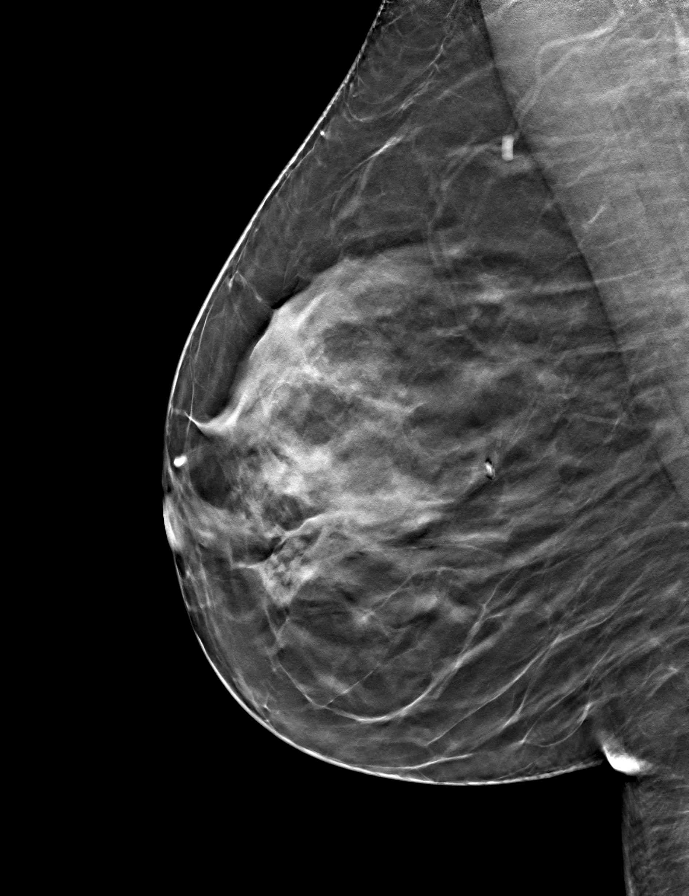

[L CC tomo · tomo slice 29/58.0]
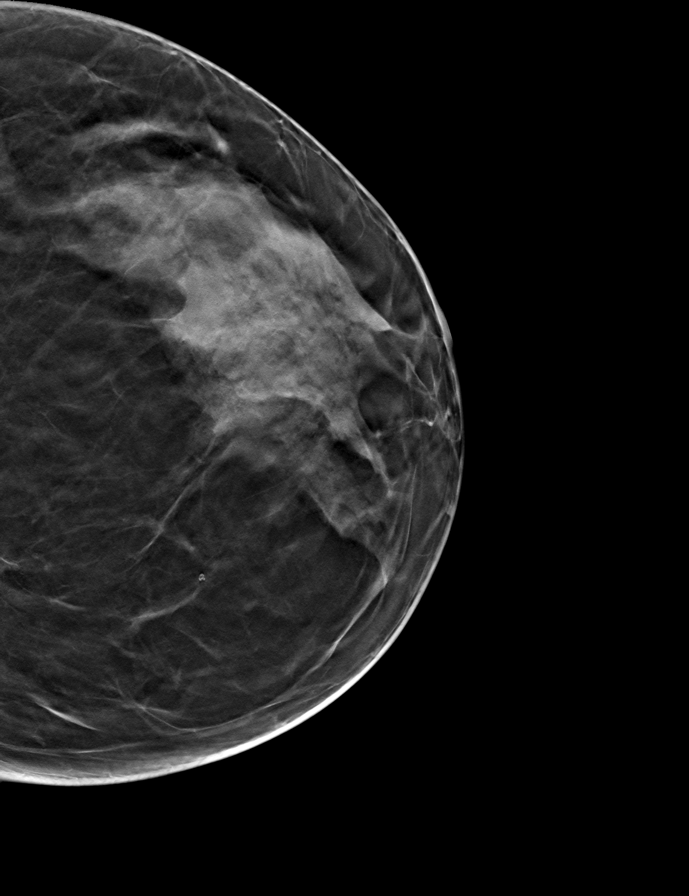

[L MLO tomo · tomo slice 31/60.0]
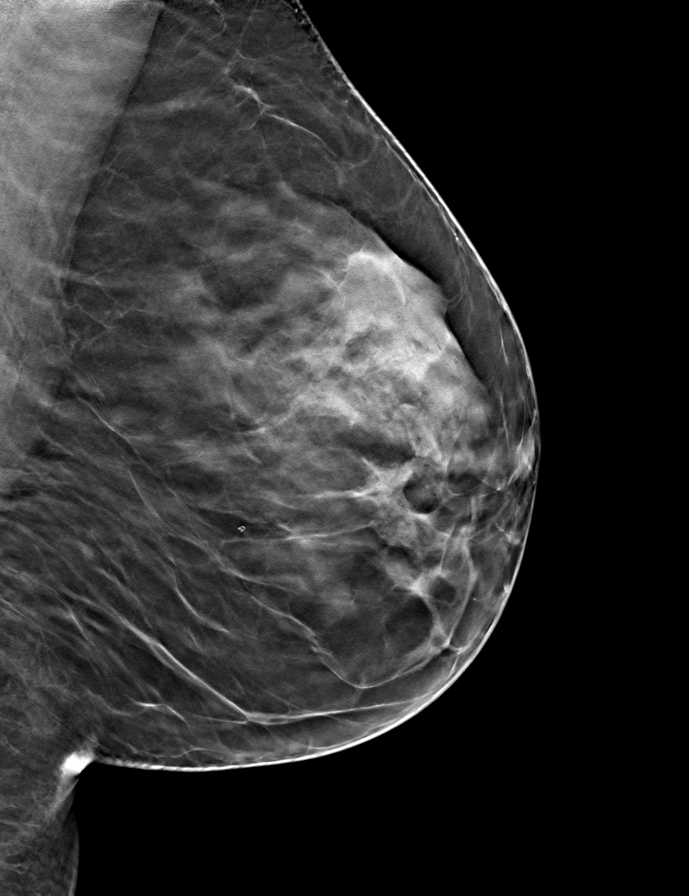

[9 of 24 positions shown; findings below may reference images not displayed]

ACR Breast Density Category c: The breast tissue is heterogeneously
dense, which may obscure small masses.
FINDINGS: There are no findings suspicious for malignancy. Images were
processed with CAD.
IMPRESSION: No mammographic evidence of malignancy. A result letter of this
screening mammogram will be mailed directly to the patient.

RECOMMENDATION:
Screening mammogram in one year. (Code:FT-U-LHB)

BI-RADS CATEGORY  1: Negative.

## 2020-12-08 ENCOUNTER — Other Ambulatory Visit: Payer: Self-pay

## 2020-12-08 ENCOUNTER — Ambulatory Visit (INDEPENDENT_AMBULATORY_CARE_PROVIDER_SITE_OTHER): Payer: Medicare Other | Admitting: Adult Health

## 2020-12-08 ENCOUNTER — Encounter: Payer: Self-pay | Admitting: Adult Health

## 2020-12-08 VITALS — BP 126/79 | HR 84 | Ht 61.0 in | Wt 132.0 lb

## 2020-12-08 DIAGNOSIS — Z1231 Encounter for screening mammogram for malignant neoplasm of breast: Secondary | ICD-10-CM | POA: Diagnosis not present

## 2020-12-08 DIAGNOSIS — Z01419 Encounter for gynecological examination (general) (routine) without abnormal findings: Secondary | ICD-10-CM | POA: Insufficient documentation

## 2020-12-08 DIAGNOSIS — Z1211 Encounter for screening for malignant neoplasm of colon: Secondary | ICD-10-CM

## 2020-12-08 DIAGNOSIS — L900 Lichen sclerosus et atrophicus: Secondary | ICD-10-CM | POA: Diagnosis not present

## 2020-12-08 DIAGNOSIS — K649 Unspecified hemorrhoids: Secondary | ICD-10-CM

## 2020-12-08 LAB — HEMOCCULT GUIAC POC 1CARD (OFFICE): Fecal Occult Blood, POC: NEGATIVE

## 2020-12-08 NOTE — Progress Notes (Signed)
Patient ID: Margaret Cantu, female   DOB: 1956/01/06, 65 y.o.   MRN: [id number] History of Present Illness: Kimberlye is a 65 year old white female,widowed, PM with 2 year partner, in for a well woman gyn exam, she had a normal pap with negative HPV 11/08/19. PCP is Dr Margo Aye.   Current Medications, Allergies, Past Medical History, Past Surgical History, Family History and Social History were reviewed in [employer] record.     Review of Systems: Patient denies any headaches, hearing loss, fatigue, blurred vision, shortness of breath, chest pain, abdominal pain, problems with bowel movements, urination, or intercourse. No joint pain or mood swings.    Physical Exam:BP 126/79 (BP Location: Left Arm, Patient Position: Sitting, Cuff Size: Normal)   Pulse 84   Ht 5\' 1"  (1.549 m)   Wt 132 lb (59.9 kg)   BMI 24.94 kg/m  General:  Well developed, well nourished, no acute distress Skin:  Warm and dry Neck:  Midline trachea, normal thyroid, good ROM, no lymphadenopathy,no carotid bruits heard  Lungs; Clear to auscultation bilaterally Breast:  No dominant palpable mass, retraction, or nipple discharge Cardiovascular: Regular rate and rhythm Abdomen:  Soft, non tender, no hepatosplenomegaly Pelvic:  External genitalia is normal in appearance, thin skin,hx LSA..  The vagina is pale with loss of moisture and rugae. Urethra has no lesions or masses. The cervix is smooth  Uterus is felt to be normal size, shape, and contour.  No adnexal masses or tenderness noted.Bladder is non tender, no masses felt. Rectal: Good sphincter tone, no polyps,+ hemorrhoids felt.  Hemoccult negative. Extremities/musculoskeletal:  No swelling,no varicosities noted, no clubbing or cyanosis Psych:  No mood changes, alert and cooperative,seems happy AA is 0 Fall risk is low PHQ 9 score is 1 GAD 7 score is 21, has a lot going on, uses xanax prn  Upstream - 12/08/20 1256      Pregnancy Intention  Screening   Does the patient want to become pregnant in the next year? N/A    Does the patient's partner want to become pregnant in the next year? N/A    Would the patient like to discuss contraceptive options today? N/A      Contraception Wrap Up   Current Method No Method - Other Reason   postmenopause   End Method No Method - Other Reason    Contraception Counseling Provided No         Examination chaperoned by Tish RN.  Impression and Plan: 1. Screening mammogram for breast cancer Call for appointment at [facility]  2. Well woman exam with routine gynecological exam Physical in 1 year Pap in 2024 Labs with PCP  3. Encounter for screening fecal occult blood testing   4. Lichen sclerosus et atrophicus Use temovate prn   5. Hemorrhoids, unspecified hemorrhoid type

## 2020-12-11 ENCOUNTER — Other Ambulatory Visit (HOSPITAL_COMMUNITY): Payer: Self-pay | Admitting: Internal Medicine

## 2020-12-11 DIAGNOSIS — M25561 Pain in right knee: Secondary | ICD-10-CM

## 2020-12-11 DIAGNOSIS — M25562 Pain in left knee: Secondary | ICD-10-CM | POA: Diagnosis not present

## 2020-12-12 ENCOUNTER — Ambulatory Visit (HOSPITAL_COMMUNITY)
Admission: RE | Admit: 2020-12-12 | Discharge: 2020-12-12 | Disposition: A | Discharge status: Home / Self Care | Payer: Medicare Other | Source: Ambulatory Visit | Attending: Internal Medicine | Admitting: Internal Medicine

## 2020-12-12 ENCOUNTER — Other Ambulatory Visit: Payer: Self-pay

## 2020-12-12 DIAGNOSIS — M25561 Pain in right knee: Secondary | ICD-10-CM | POA: Diagnosis not present

## 2020-12-12 DIAGNOSIS — M25562 Pain in left knee: Secondary | ICD-10-CM | POA: Diagnosis not present

## 2020-12-26 DIAGNOSIS — M25562 Pain in left knee: Secondary | ICD-10-CM | POA: Diagnosis not present

## 2020-12-26 DIAGNOSIS — M25561 Pain in right knee: Secondary | ICD-10-CM | POA: Diagnosis not present

## 2021-01-09 DIAGNOSIS — R7303 Prediabetes: Secondary | ICD-10-CM | POA: Diagnosis not present

## 2021-01-09 DIAGNOSIS — Z0000 Encounter for general adult medical examination without abnormal findings: Secondary | ICD-10-CM | POA: Diagnosis not present

## 2021-01-09 DIAGNOSIS — E782 Mixed hyperlipidemia: Secondary | ICD-10-CM | POA: Diagnosis not present

## 2021-01-09 DIAGNOSIS — I10 Essential (primary) hypertension: Secondary | ICD-10-CM | POA: Diagnosis not present

## 2021-01-09 DIAGNOSIS — R7301 Impaired fasting glucose: Secondary | ICD-10-CM | POA: Diagnosis not present

## 2021-01-11 DIAGNOSIS — Z23 Encounter for immunization: Secondary | ICD-10-CM | POA: Diagnosis not present

## 2021-01-11 DIAGNOSIS — L299 Pruritus, unspecified: Secondary | ICD-10-CM | POA: Diagnosis not present

## 2021-01-11 DIAGNOSIS — R945 Abnormal results of liver function studies: Secondary | ICD-10-CM | POA: Diagnosis not present

## 2021-01-11 DIAGNOSIS — F411 Generalized anxiety disorder: Secondary | ICD-10-CM | POA: Insufficient documentation

## 2021-01-11 DIAGNOSIS — N390 Urinary tract infection, site not specified: Secondary | ICD-10-CM | POA: Diagnosis not present

## 2021-01-11 DIAGNOSIS — R7401 Elevation of levels of liver transaminase levels: Secondary | ICD-10-CM | POA: Insufficient documentation

## 2021-01-11 DIAGNOSIS — E119 Type 2 diabetes mellitus without complications: Secondary | ICD-10-CM | POA: Insufficient documentation

## 2021-01-11 DIAGNOSIS — E782 Mixed hyperlipidemia: Secondary | ICD-10-CM | POA: Insufficient documentation

## 2021-01-11 DIAGNOSIS — R002 Palpitations: Secondary | ICD-10-CM | POA: Diagnosis not present

## 2021-01-11 DIAGNOSIS — Z0001 Encounter for general adult medical examination with abnormal findings: Secondary | ICD-10-CM | POA: Diagnosis not present

## 2021-01-15 ENCOUNTER — Ambulatory Visit (HOSPITAL_COMMUNITY)
Admission: RE | Admit: 2021-01-15 | Discharge: 2021-01-15 | Disposition: A | Discharge status: Home / Self Care | Payer: Medicare Other | Source: Ambulatory Visit | Attending: Adult Health | Admitting: Adult Health

## 2021-01-15 ENCOUNTER — Other Ambulatory Visit: Payer: Self-pay

## 2021-01-15 DIAGNOSIS — Z1231 Encounter for screening mammogram for malignant neoplasm of breast: Secondary | ICD-10-CM | POA: Diagnosis not present

## 2021-04-17 DIAGNOSIS — Z0100 Encounter for examination of eyes and vision without abnormal findings: Secondary | ICD-10-CM | POA: Diagnosis not present

## 2021-04-17 DIAGNOSIS — E119 Type 2 diabetes mellitus without complications: Secondary | ICD-10-CM | POA: Diagnosis not present

## 2021-05-08 DIAGNOSIS — I10 Essential (primary) hypertension: Secondary | ICD-10-CM | POA: Insufficient documentation

## 2021-05-15 DIAGNOSIS — I10 Essential (primary) hypertension: Secondary | ICD-10-CM | POA: Diagnosis not present

## 2021-05-15 DIAGNOSIS — E119 Type 2 diabetes mellitus without complications: Secondary | ICD-10-CM | POA: Diagnosis not present

## 2021-05-17 ENCOUNTER — Other Ambulatory Visit (HOSPITAL_COMMUNITY): Payer: Self-pay | Admitting: Family Medicine

## 2021-05-17 DIAGNOSIS — F5104 Psychophysiologic insomnia: Secondary | ICD-10-CM | POA: Insufficient documentation

## 2021-05-17 DIAGNOSIS — E119 Type 2 diabetes mellitus without complications: Secondary | ICD-10-CM | POA: Diagnosis not present

## 2021-05-17 DIAGNOSIS — L309 Dermatitis, unspecified: Secondary | ICD-10-CM | POA: Diagnosis not present

## 2021-05-17 DIAGNOSIS — Z1211 Encounter for screening for malignant neoplasm of colon: Secondary | ICD-10-CM | POA: Diagnosis not present

## 2021-05-17 DIAGNOSIS — M810 Age-related osteoporosis without current pathological fracture: Secondary | ICD-10-CM | POA: Insufficient documentation

## 2021-05-17 DIAGNOSIS — R002 Palpitations: Secondary | ICD-10-CM | POA: Insufficient documentation

## 2021-05-17 DIAGNOSIS — Z13820 Encounter for screening for osteoporosis: Secondary | ICD-10-CM

## 2021-05-17 DIAGNOSIS — E782 Mixed hyperlipidemia: Secondary | ICD-10-CM | POA: Diagnosis not present

## 2021-05-17 DIAGNOSIS — L299 Pruritus, unspecified: Secondary | ICD-10-CM | POA: Diagnosis not present

## 2021-05-17 DIAGNOSIS — Z0001 Encounter for general adult medical examination with abnormal findings: Secondary | ICD-10-CM | POA: Diagnosis not present

## 2021-05-17 DIAGNOSIS — R945 Abnormal results of liver function studies: Secondary | ICD-10-CM | POA: Diagnosis not present

## 2021-05-21 ENCOUNTER — Ambulatory Visit (HOSPITAL_COMMUNITY)
Admission: RE | Admit: 2021-05-21 | Discharge: 2021-05-21 | Disposition: A | Discharge status: Home / Self Care | Payer: Medicare Other | Source: Ambulatory Visit | Attending: Family Medicine | Admitting: Family Medicine

## 2021-05-21 ENCOUNTER — Other Ambulatory Visit: Payer: Self-pay

## 2021-05-21 DIAGNOSIS — M8589 Other specified disorders of bone density and structure, multiple sites: Secondary | ICD-10-CM | POA: Insufficient documentation

## 2021-05-21 DIAGNOSIS — Z780 Asymptomatic menopausal state: Secondary | ICD-10-CM | POA: Insufficient documentation

## 2021-05-21 DIAGNOSIS — Z13820 Encounter for screening for osteoporosis: Secondary | ICD-10-CM | POA: Diagnosis not present

## 2021-05-30 ENCOUNTER — Encounter: Payer: Self-pay | Admitting: *Deleted

## 2021-07-02 ENCOUNTER — Encounter: Payer: Self-pay | Admitting: *Deleted

## 2021-07-02 ENCOUNTER — Ambulatory Visit: Payer: Medicare Other

## 2021-07-16 DIAGNOSIS — R350 Frequency of micturition: Secondary | ICD-10-CM | POA: Diagnosis not present

## 2021-07-16 DIAGNOSIS — I10 Essential (primary) hypertension: Secondary | ICD-10-CM | POA: Diagnosis not present

## 2021-07-16 DIAGNOSIS — L309 Dermatitis, unspecified: Secondary | ICD-10-CM | POA: Diagnosis not present

## 2021-07-16 DIAGNOSIS — L219 Seborrheic dermatitis, unspecified: Secondary | ICD-10-CM | POA: Diagnosis not present

## 2021-07-16 DIAGNOSIS — R1030 Lower abdominal pain, unspecified: Secondary | ICD-10-CM | POA: Insufficient documentation

## 2021-07-19 ENCOUNTER — Other Ambulatory Visit: Payer: Self-pay

## 2021-07-19 ENCOUNTER — Ambulatory Visit
Admission: RE | Admit: 2021-07-19 | Discharge: 2021-07-19 | Disposition: A | Discharge status: Home / Self Care | Payer: Medicare Other | Source: Ambulatory Visit | Attending: Family Medicine | Admitting: Family Medicine

## 2021-07-19 VITALS — BP 112/75 | HR 91 | Temp 98.8°F | Resp 16

## 2021-07-19 DIAGNOSIS — J101 Influenza due to other identified influenza virus with other respiratory manifestations: Secondary | ICD-10-CM

## 2021-07-19 LAB — POCT INFLUENZA A/B
Influenza A, POC: POSITIVE — AB
Influenza B, POC: NEGATIVE

## 2021-07-19 MED ORDER — OSELTAMIVIR PHOSPHATE 75 MG PO CAPS: 75.0000 mg | ORAL_CAPSULE | Freq: Two times a day (BID) | ORAL | 0 refills | Status: AC

## 2021-07-19 MED ORDER — PROMETHAZINE-DM 6.25-15 MG/5ML PO SYRP: 5.0000 mL | ORAL_SOLUTION | Freq: Four times a day (QID) | ORAL | 0 refills | Status: DC | PRN

## 2021-07-19 NOTE — ED Provider Notes (Signed)
[facility]   [id number] 07/19/21 Arrival Time: 1755  ASSESSMENT & PLAN:  1. Influenza A    Results for orders placed or performed during the hospital encounter of 07/19/21  POCT Influenza A/B  Result Value Ref Range   Influenza A, POC Positive (A) Negative   Influenza B, POC Negative Negative   Discussed typical duration of viral illnesses. OTC symptom care as needed.  Meds ordered this encounter  Medications   promethazine-dextromethorphan (PROMETHAZINE-DM) 6.25-15 MG/5ML syrup    Sig: Take 5 mLs by mouth 4 (four) times daily as needed for cough.    Dispense:  118 mL    Refill:  0   oseltamivir (TAMIFLU) 75 MG capsule    Sig: Take 1 capsule (75 mg total) by mouth 2 (two) times daily for 5 days.    Dispense:  10 capsule    Refill:  0     Follow-up Information     Benita Stabile, MD.   Specialty: Internal Medicine Why: As needed. Contact information: [address] [place] [facility] [address] [phone]                 Reviewed expectations re: course of current medical issues. Questions answered. Outlined signs and symptoms indicating need for more acute intervention. Understanding verbalized. After Visit Summary given.   SUBJECTIVE: History from: patient. Margaret Cantu is a 65 y.o. female who reports: ST, fever, chills; abrupt onset; today. Denies: difficulty breathing. Normal PO intake without n/v/d.   OBJECTIVE:  Vitals:   07/19/21 1814  BP: 112/75  Pulse: 91  Resp: 16  Temp: 98.8 F (37.1 C)  TempSrc: Oral  SpO2: 95%    General appearance: alert; no distress Eyes: PERRLA; EOMI; conjunctiva normal HENT: [place]; AT; with nasal congestion Neck: supple  Lungs: speaks full sentences without difficulty; unlabored; dry cough Extremities: no edema Skin: warm and dry Neurologic: normal gait Psychological: alert and cooperative; normal mood and affect  Labs: Results for orders placed or performed during the hospital encounter  of 07/19/21  POCT Influenza A/B  Result Value Ref Range   Influenza A, POC Positive (A) Negative   Influenza B, POC Negative Negative   Labs Reviewed  POCT INFLUENZA A/B - Abnormal; Notable for the following components:      Result Value   Influenza A, POC Positive (*)    All other components within normal limits    Imaging: No results found.  Allergies  Allergen Reactions   Hydrocodone Itching    Past Medical History:  Diagnosis Date   Anxiety    Diabetes mellitus without complication (HCC)    pre-diabetes   Gallstones    Hyperlipidemia    Migraine    Renal disorder    kidney stones   Social History   Socioeconomic History   Marital status: Widowed    Spouse name: Not on file   Number of children: Not on file   Years of education: Not on file   Highest education level: Not on file  Occupational History   Not on file  Tobacco Use   Smoking status: Never   Smokeless tobacco: Never  Vaping Use   Vaping Use: Never used  Substance and Sexual Activity   Alcohol use: Not Currently   Drug use: No   Sexual activity: Yes    Birth control/protection: Post-menopausal  Other Topics Concern   Not on file  Social History Narrative   Not on file   Social Determinants of  Health   Financial Resource Strain: Low Risk    Difficulty of Paying Living Expenses: Not hard at all  Food Insecurity: No Food Insecurity   Worried About Running Out of Food in the Last Year: Never true   Ran Out of Food in the Last Year: Never true  Transportation Needs: No Transportation Needs   Lack of Transportation (Medical): No   Lack of Transportation (Non-Medical): No  Physical Activity: Inactive   Days of Exercise per Week: 0 days   Minutes of Exercise per Session: 0 min  Stress: Stress Concern Present   Feeling of Stress : To some extent  Social Connections: Moderately Integrated   Frequency of Communication with Friends and Family: More than three times a week   Frequency of  Social Gatherings with Friends and Family: More than three times a week   Attends Religious Services: More than 4 times per year   Active Member of [employer] or Organizations: Yes   Attends Banker Meetings: More than 4 times per year   Marital Status: Widowed  Catering manager Violence: Not At Risk   Fear of Current or Ex-Partner: No   Emotionally Abused: No   Physically Abused: No   Sexually Abused: No   Family History  Problem Relation Age of Onset   Heart attack Maternal Grandmother    Heart attack Father    Alcohol abuse Father    Other Mother        septic shock   COPD Brother    Other Sister        MVA   COPD Brother    Past Surgical History:  Procedure Laterality Date   APPENDECTOMY     LITHOTRIPSY       Mardella Layman, MD 07/19/21 1945

## 2021-07-19 NOTE — ED Triage Notes (Signed)
Patient c/o fever, chills, and sore throat x 1 day.   Patient endorses a temperature of 104 F at it's highest at home.   Patient endorses nausea and generalized body aches.   Patient has taken tylenol with some relief of symptoms.

## 2021-08-20 DIAGNOSIS — E119 Type 2 diabetes mellitus without complications: Secondary | ICD-10-CM | POA: Diagnosis not present

## 2021-08-20 DIAGNOSIS — E782 Mixed hyperlipidemia: Secondary | ICD-10-CM | POA: Diagnosis not present

## 2021-08-23 DIAGNOSIS — R945 Abnormal results of liver function studies: Secondary | ICD-10-CM | POA: Diagnosis not present

## 2021-08-23 DIAGNOSIS — E782 Mixed hyperlipidemia: Secondary | ICD-10-CM | POA: Diagnosis not present

## 2021-08-23 DIAGNOSIS — L299 Pruritus, unspecified: Secondary | ICD-10-CM | POA: Diagnosis not present

## 2021-08-23 DIAGNOSIS — M8580 Other specified disorders of bone density and structure, unspecified site: Secondary | ICD-10-CM | POA: Diagnosis not present

## 2021-08-23 DIAGNOSIS — Z0000 Encounter for general adult medical examination without abnormal findings: Secondary | ICD-10-CM | POA: Diagnosis not present

## 2021-08-23 DIAGNOSIS — Z1211 Encounter for screening for malignant neoplasm of colon: Secondary | ICD-10-CM | POA: Diagnosis not present

## 2021-08-23 DIAGNOSIS — R110 Nausea: Secondary | ICD-10-CM | POA: Insufficient documentation

## 2021-08-23 DIAGNOSIS — L309 Dermatitis, unspecified: Secondary | ICD-10-CM | POA: Diagnosis not present

## 2021-08-23 DIAGNOSIS — E119 Type 2 diabetes mellitus without complications: Secondary | ICD-10-CM | POA: Diagnosis not present

## 2021-10-22 DIAGNOSIS — L309 Dermatitis, unspecified: Secondary | ICD-10-CM | POA: Diagnosis not present

## 2021-10-22 DIAGNOSIS — G4700 Insomnia, unspecified: Secondary | ICD-10-CM | POA: Diagnosis not present

## 2021-10-22 DIAGNOSIS — R0781 Pleurodynia: Secondary | ICD-10-CM | POA: Diagnosis not present

## 2021-11-26 DIAGNOSIS — G4700 Insomnia, unspecified: Secondary | ICD-10-CM | POA: Diagnosis not present

## 2021-11-26 DIAGNOSIS — G629 Polyneuropathy, unspecified: Secondary | ICD-10-CM | POA: Insufficient documentation

## 2021-12-18 ENCOUNTER — Encounter: Payer: Self-pay | Admitting: Adult Health

## 2021-12-18 ENCOUNTER — Ambulatory Visit (INDEPENDENT_AMBULATORY_CARE_PROVIDER_SITE_OTHER): Payer: Medicare Other | Admitting: Adult Health

## 2021-12-18 VITALS — BP 158/91 | HR 69 | Ht 61.0 in | Wt 124.0 lb

## 2021-12-18 DIAGNOSIS — Z01419 Encounter for gynecological examination (general) (routine) without abnormal findings: Secondary | ICD-10-CM | POA: Diagnosis not present

## 2021-12-18 DIAGNOSIS — Z1211 Encounter for screening for malignant neoplasm of colon: Secondary | ICD-10-CM

## 2021-12-18 DIAGNOSIS — L309 Dermatitis, unspecified: Secondary | ICD-10-CM | POA: Diagnosis not present

## 2021-12-18 DIAGNOSIS — L900 Lichen sclerosus et atrophicus: Secondary | ICD-10-CM

## 2021-12-18 LAB — HEMOCCULT GUIAC POC 1CARD (OFFICE): Fecal Occult Blood, POC: NEGATIVE

## 2021-12-18 MED ORDER — PIMECROLIMUS 1 % EX CREA: TOPICAL_CREAM | CUTANEOUS | 1 refills | Status: DC

## 2021-12-18 MED ORDER — CLOBETASOL PROPIONATE 0.05 % EX OINT: TOPICAL_OINTMENT | CUTANEOUS | 3 refills | Status: DC

## 2021-12-18 NOTE — Progress Notes (Signed)
Patient ID: Margaret Cantu, female   DOB: November 17, 1955, 66 y.o.   MRN: [id number] ?History of Present Illness: ?Margaret Cantu is a 66 year old white female, widowed, PM in for well woman gyn exam. She works as PCA. ?Lab Results  ?Component Value Date  ? DIAGPAP  11/08/2019  ?  - Negative for intraepithelial lesion or malignancy (NILM)  ? HPV NOT DETECTED 07/30/2016  ? Keith Negative 11/08/2019  ?  ?PCP is Dr Nevada Crane. ? ?Current Medications, Allergies, Past Medical History, Past Surgical History, Family History and Social History were reviewed in [employer] record.   ? ? ?Review of Systems: ?Patient denies any headaches, hearing loss, fatigue, blurred vision, shortness of breath, chest pain, abdominal pain, problems with bowel movements, urination, or intercourse. No joint pain or mood swings.  ?Has rash on neck and arm that itches and comes and goes.  ? ? ?Physical Exam:BP (!) 158/91 (BP Location: Right Arm, Patient Position: Sitting, Cuff Size: Normal)   Pulse 69   Ht 5\' 1"  (1.549 m)   Wt 124 lb (56.2 kg)   BMI 23.43 kg/m?   ?General:  Well developed, well nourished, no acute distress ?Skin:  Warm and dry ?Neck:  Midline trachea, normal thyroid, good ROM, no lymphadenopathy,no carotid bruits heard has red thickened rash right side of neck ?Lungs; Clear to auscultation bilaterally ?Breast:  No dominant palpable mass, retraction, or nipple discharge ?Cardiovascular: Regular rate and rhythm ?Abdomen:  Soft, non tender, no hepatosplenomegaly ?Pelvic:  External genitalia is normal in appearance, skin is  white and thickened bilateral labia near clitoris.    The vagina is pale with loss of rugae Urethra has no lesions or masses. The cervix is smooth.  Uterus is felt to be normal size, shape, and contour.  No adnexal masses or tenderness noted.Bladder is non tender, no masses felt. ?Rectal: Good sphincter tone, no polyps, or hemorrhoids felt.  Hemoccult negative. ?Extremities/musculoskeletal:  No  swelling or varicosities noted, no clubbing or cyanosis ?Psych:  No mood changes, alert and cooperative,seems happy ?AA is 0 ?Fall risk is low ? ?  12/18/2021  ?  1:46 PM 12/08/2020  ?  1:00 PM 11/08/2019  ?  1:44 PM  ?Depression screen PHQ 2/9  ?Decreased Interest 0 0 0  ?Down, Depressed, Hopeless 1  0  ?PHQ - 2 Score 1 0 0  ?Altered sleeping 3 1   ?Tired, decreased energy 3 0   ?Change in appetite 0 0   ?Feeling bad or failure about yourself  0 0   ?Trouble concentrating 3 0   ?Moving slowly or fidgety/restless 0 0   ?Suicidal thoughts 0 0   ?PHQ-9 Score 10 1   ? She uses xanax prn ? ?Examination chaperoned by Celene Squibb LPN ? ? ?Impression and Plan: ?1. Encounter for well woman exam with routine gynecological exam ?Pap and physical in 1 year ?Mammogram was negative 01/15/21 ?Labs with PCP ? ?2. Encounter for screening fecal occult blood testing ?Hemoccult was negative  ? ?3. Lichen sclerosus et atrophicus ?-need to resume using temovate ?Meds ordered this encounter  ?Medications  ? pimecrolimus (ELIDEL) 1 % cream  ?  Sig: Apply bid to affected area on throat and arms  ?  Dispense:  30 g  ?  Refill:  1  ?  Order Specific Question:   Supervising Provider  ?  Answer:   Tania Ade H [[id number]]  ? clobetasol ointment (TEMOVATE) 0.05 %  ?  Sig: Take bid  to affected area for 2 weeks then 2-3 x wekly  ?  Dispense:  45 g  ?  Refill:  3  ?  Order Specific Question:   Supervising Provider  ?  Answer:   Tania Ade H [[id number]]  ?  ? ?4. Eczema, unspecified type ?Will rx elidel  1% cream  ? ? ? ?  ?  ?

## 2021-12-28 ENCOUNTER — Other Ambulatory Visit: Payer: Self-pay

## 2021-12-28 MED ORDER — PIMECROLIMUS 1 % EX CREA: TOPICAL_CREAM | CUTANEOUS | 1 refills | Status: DC

## 2022-02-07 ENCOUNTER — Ambulatory Visit: Payer: Medicare Other | Admitting: Adult Health

## 2022-02-15 DIAGNOSIS — E782 Mixed hyperlipidemia: Secondary | ICD-10-CM | POA: Diagnosis not present

## 2022-02-15 DIAGNOSIS — E119 Type 2 diabetes mellitus without complications: Secondary | ICD-10-CM | POA: Diagnosis not present

## 2022-03-08 DIAGNOSIS — E782 Mixed hyperlipidemia: Secondary | ICD-10-CM | POA: Diagnosis not present

## 2022-03-08 DIAGNOSIS — E119 Type 2 diabetes mellitus without complications: Secondary | ICD-10-CM | POA: Diagnosis not present

## 2022-03-08 DIAGNOSIS — L309 Dermatitis, unspecified: Secondary | ICD-10-CM | POA: Diagnosis not present

## 2022-03-08 DIAGNOSIS — M8580 Other specified disorders of bone density and structure, unspecified site: Secondary | ICD-10-CM | POA: Diagnosis not present

## 2022-03-08 DIAGNOSIS — L299 Pruritus, unspecified: Secondary | ICD-10-CM | POA: Diagnosis not present

## 2022-03-28 ENCOUNTER — Ambulatory Visit (INDEPENDENT_AMBULATORY_CARE_PROVIDER_SITE_OTHER): Payer: Medicare Other

## 2022-03-28 ENCOUNTER — Ambulatory Visit
Admission: EM | Admit: 2022-03-28 | Discharge: 2022-03-28 | Disposition: A | Discharge status: Home / Self Care | Payer: Medicare Other | Attending: Family Medicine | Admitting: Family Medicine

## 2022-03-28 ENCOUNTER — Other Ambulatory Visit: Payer: Self-pay

## 2022-03-28 ENCOUNTER — Encounter: Payer: Self-pay | Admitting: Emergency Medicine

## 2022-03-28 DIAGNOSIS — M79671 Pain in right foot: Secondary | ICD-10-CM | POA: Diagnosis not present

## 2022-03-28 DIAGNOSIS — S93401A Sprain of unspecified ligament of right ankle, initial encounter: Secondary | ICD-10-CM | POA: Diagnosis not present

## 2022-03-28 DIAGNOSIS — M25571 Pain in right ankle and joints of right foot: Secondary | ICD-10-CM | POA: Diagnosis not present

## 2022-03-28 DIAGNOSIS — S93601A Unspecified sprain of right foot, initial encounter: Secondary | ICD-10-CM | POA: Diagnosis not present

## 2022-03-28 NOTE — ED Triage Notes (Signed)
Pt reports right ankle and foot turned yesterday while at the park yesterday. Moderate swelling and bruising noted to right foot and ankle. Pt able to drive and bear weight for ambulation.

## 2022-03-28 NOTE — ED Provider Notes (Signed)
[facility]    CSN: [id number] Arrival date & time: 03/28/22  1113      History   Chief Complaint Chief Complaint  Patient presents with   Foot Pain    HPI Margaret Cantu is a 66 y.o. female.   Presenting today with 1 day history of right foot and ankle pain, bruising, swelling after [place] the foot [place] the park [place].  She has been using ice, elevation and Tylenol with minimal relief.  Able to bear weight but very painful to do so.  Denies numbness, tingling, loss of range of motion.   Past Medical History:  Diagnosis Date   Anxiety    Diabetes mellitus without complication ([place])    pre-diabetes   Gallstones    Hyperlipidemia    Migraine    Renal disorder    kidney stones   Patient Active Problem List   Diagnosis Date Noted   Encounter for well woman exam with routine gynecological exam 12/18/2021   Eczema 12/18/2021   Encounter for screening fecal occult blood testing 12/08/2020   Well woman exam with routine gynecological exam 12/08/2020   Screening mammogram for breast cancer 12/08/2020   Pelvic pain 05/16/2020   Screening for colorectal cancer [id number]   Lichen sclerosus et atrophicus 04/07/2019   Hemorrhoids 04/07/2019   Encounter for gynecological examination with Papanicolaou smear of cervix 07/30/2016    Past Surgical History:  Procedure Laterality Date   APPENDECTOMY     LITHOTRIPSY      OB History     Gravida  4   Para  4   Term  3   Preterm  1   AB      Living  4      SAB      IAB      Ectopic      Multiple      Live Births  4           Home Medications    Prior to Admission medications   Medication Sig Start Date End Date Taking? Authorizing Provider  ALPRAZolam Duanne Moron) 1 MG tablet Take 1 mg by mouth at bedtime as needed for anxiety.    [provider]  atenolol (TENORMIN) 50 MG tablet Take 50 mg by mouth at bedtime. Patient not taking: Reported on 12/18/2021 11/27/20    [provider]  clobetasol ointment (TEMOVATE) 0.05 % Take bid to affected area for 2 weeks then 2-3 x wekly 12/18/21   Estill Dooms, NP  hydrochlorothiazide (HYDRODIURIL) 25 MG tablet Take 25 mg by mouth daily. 10/05/19   [provider]  metFORMIN (GLUCOPHAGE) 500 MG tablet TAKE ONE TABLET BY MOUTH ONCE DAILY 07/24/16   [provider]  ondansetron (ZOFRAN) 4 MG tablet Take 1 tablet (4 mg total) by mouth every 6 (six) hours. Patient not taking: Reported on 05/16/2020 03/31/19   Kem Parkinson, PA-C  pimecrolimus (ELIDEL) 1 % cream Apply bid to affected area on throat and arms 12/28/21   Estill Dooms, NP  promethazine-dextromethorphan (PROMETHAZINE-DM) 6.25-15 MG/5ML syrup Take 5 mLs by mouth 4 (four) times daily as needed for cough. Patient not taking: Reported on 12/18/2021 07/19/21   Vanessa Kick, MD  rosuvastatin (CRESTOR) 10 MG tablet Take 10 mg by mouth daily. Patient not taking: Reported on 12/18/2021    [provider]  UNABLE TO FIND daily. Quinol Patient not taking: Reported on 12/08/2020    [provider]   Family History Family  History  Problem Relation Age of Onset   Heart attack Maternal Grandmother    Heart attack Father    Alcohol abuse Father    Other Mother        septic shock   COPD Brother    Other Sister        MVA   COPD Brother    Social History Social History   Tobacco Use   Smoking status: Never   Smokeless tobacco: Never  Vaping Use   Vaping Use: Never used  Substance Use Topics   Alcohol use: Not Currently   Drug use: No     Allergies   Hydrocodone   Review of Systems Review of Systems Per HPI  Physical Exam Triage Vital Signs ED Triage Vitals [03/28/22 1149]  Enc Vitals Group     BP (!) 166/74     Pulse Rate 83     Resp 18     Temp 98.2 F (36.8 C)     Temp Source Oral     SpO2 98 %     Weight      Height      Head Circumference      Peak Flow      Pain Score 5     Pain  Loc      Pain Edu?      Excl. in GC?    No data found.  Updated Vital Signs BP (!) 166/74 (BP Location: Right Arm)   Pulse 83   Temp 98.2 F (36.8 C) (Oral)   Resp 18   SpO2 98%   Visual Acuity Right Eye Distance:   Left Eye Distance:   Bilateral Distance:    Right Eye Near:   Left Eye Near:    Bilateral Near:     Physical Exam Vitals and nursing note reviewed.  Constitutional:      Appearance: Normal appearance. She is not ill-appearing.  HENT:     Head: Atraumatic.     Mouth/Throat:     Mouth: Mucous membranes are moist.  Eyes:     Extraocular Movements: Extraocular movements intact.     Conjunctiva/sclera: Conjunctivae normal.  Cardiovascular:     Rate and Rhythm: Normal rate and regular rhythm.     Heart sounds: Normal heart sounds.  Pulmonary:     Effort: Pulmonary effort is normal.     Breath sounds: Normal breath sounds.  Musculoskeletal:        General: Swelling, tenderness and signs of injury present. No deformity. Normal range of motion.     Cervical back: Normal range of motion and neck supple.     Comments: Right dorsal foot and diffusely across ankle edematous, bruised, tender to palpation with no bony deformity or point tenderness palpable  Skin:    General: Skin is warm and dry.     Findings: Bruising present.  Neurological:     Mental Status: She is alert and oriented to person, place, and time.     Comments: Right lower extremity neurovascularly intact  Psychiatric:        Mood and Affect: Mood normal.        Thought Content: Thought content normal.        Judgment: Judgment normal.     UC Treatments / Results  Labs (all labs ordered are listed, but only abnormal results are displayed) Labs Reviewed - No data to display  EKG   Radiology DG Foot Complete Right  Result Date: 03/28/2022 CLINICAL DATA:  right foot and ankle pain after twisting injury at the park yesterday EXAM: RIGHT ANKLE - COMPLETE 3+ VIEW; RIGHT FOOT COMPLETE - 3+  VIEW COMPARISON:  None Available. FINDINGS: There is no evidence of fracture, dislocation, or joint effusion. There is no evidence of arthropathy or other focal bone abnormality. Mild edema about the ankle and proximal foot. IMPRESSION: No fracture or dislocation. Mild edema about the ankle and proximal foot. Electronically Signed   By: Marjo Bicker M.D.   On: 03/28/2022 12:05   DG Ankle Complete Right  Result Date: 03/28/2022 CLINICAL DATA:  right foot and ankle pain after twisting injury at the park yesterday EXAM: RIGHT ANKLE - COMPLETE 3+ VIEW; RIGHT FOOT COMPLETE - 3+ VIEW COMPARISON:  None Available. FINDINGS: There is no evidence of fracture, dislocation, or joint effusion. There is no evidence of arthropathy or other focal bone abnormality. Mild edema about the ankle and proximal foot. IMPRESSION: No fracture or dislocation. Mild edema about the ankle and proximal foot. Electronically Signed   By: Marjo Bicker M.D.   On: 03/28/2022 12:05    Procedures Procedures (including critical care time)  Medications Ordered in UC Medications - No data to display  Initial Impression / Assessment and Plan / UC Course  I have reviewed the triage vital signs and the nursing notes.  Pertinent labs & imaging results that were available during my care of the patient were reviewed by me and considered in my medical decision making (see chart for details).     X-rays negative for acute bony abnormality.  Ace wrap applied, discussed RICE protocol, over-the-counter pain relievers and return precautions.  Final Clinical Impressions(s) / UC Diagnoses   Final diagnoses:  Acute right ankle pain  Right foot pain   Discharge Instructions   None    ED Prescriptions   None    PDMP not reviewed this encounter.   Particia Nearing, [place] 03/28/22 1329

## 2022-04-06 DIAGNOSIS — S93401A Sprain of unspecified ligament of right ankle, initial encounter: Secondary | ICD-10-CM | POA: Diagnosis not present

## 2022-04-22 DIAGNOSIS — L308 Other specified dermatitis: Secondary | ICD-10-CM | POA: Diagnosis not present

## 2022-05-15 ENCOUNTER — Other Ambulatory Visit: Payer: Self-pay | Admitting: Nurse Practitioner

## 2022-05-15 ENCOUNTER — Other Ambulatory Visit (HOSPITAL_COMMUNITY): Payer: Self-pay | Admitting: Nurse Practitioner

## 2022-05-15 DIAGNOSIS — M25579 Pain in unspecified ankle and joints of unspecified foot: Secondary | ICD-10-CM

## 2022-05-15 DIAGNOSIS — S93401A Sprain of unspecified ligament of right ankle, initial encounter: Secondary | ICD-10-CM | POA: Diagnosis not present

## 2022-05-15 DIAGNOSIS — L853 Xerosis cutis: Secondary | ICD-10-CM | POA: Diagnosis not present

## 2022-05-17 DIAGNOSIS — M79671 Pain in right foot: Secondary | ICD-10-CM | POA: Diagnosis not present

## 2022-05-17 DIAGNOSIS — R922 Inconclusive mammogram: Secondary | ICD-10-CM | POA: Diagnosis not present

## 2022-05-17 DIAGNOSIS — I10 Essential (primary) hypertension: Secondary | ICD-10-CM | POA: Diagnosis not present

## 2022-05-17 DIAGNOSIS — Z1211 Encounter for screening for malignant neoplasm of colon: Secondary | ICD-10-CM | POA: Diagnosis not present

## 2022-05-17 DIAGNOSIS — Z1231 Encounter for screening mammogram for malignant neoplasm of breast: Secondary | ICD-10-CM | POA: Diagnosis not present

## 2022-05-17 DIAGNOSIS — M25571 Pain in right ankle and joints of right foot: Secondary | ICD-10-CM | POA: Diagnosis not present

## 2022-05-21 ENCOUNTER — Other Ambulatory Visit (HOSPITAL_COMMUNITY): Payer: Self-pay | Admitting: Adult Health Nurse Practitioner

## 2022-05-21 DIAGNOSIS — Z1231 Encounter for screening mammogram for malignant neoplasm of breast: Secondary | ICD-10-CM

## 2022-05-22 ENCOUNTER — Ambulatory Visit (HOSPITAL_COMMUNITY)
Admission: RE | Admit: 2022-05-22 | Discharge: 2022-05-22 | Disposition: A | Discharge status: Home / Self Care | Payer: Medicare Other | Source: Ambulatory Visit | Attending: Adult Health Nurse Practitioner | Admitting: Adult Health Nurse Practitioner

## 2022-05-22 DIAGNOSIS — Z1231 Encounter for screening mammogram for malignant neoplasm of breast: Secondary | ICD-10-CM | POA: Insufficient documentation

## 2022-05-23 ENCOUNTER — Other Ambulatory Visit (HOSPITAL_COMMUNITY): Payer: Self-pay | Admitting: Internal Medicine

## 2022-05-23 DIAGNOSIS — R928 Other abnormal and inconclusive findings on diagnostic imaging of breast: Secondary | ICD-10-CM

## 2022-05-27 ENCOUNTER — Other Ambulatory Visit (HOSPITAL_COMMUNITY): Payer: Self-pay | Admitting: Adult Health Nurse Practitioner

## 2022-05-27 DIAGNOSIS — R928 Other abnormal and inconclusive findings on diagnostic imaging of breast: Secondary | ICD-10-CM

## 2022-05-28 ENCOUNTER — Other Ambulatory Visit (HOSPITAL_COMMUNITY): Payer: Self-pay | Admitting: Otolaryngology

## 2022-05-28 ENCOUNTER — Other Ambulatory Visit: Payer: Self-pay | Admitting: Otolaryngology

## 2022-05-28 DIAGNOSIS — M79671 Pain in right foot: Secondary | ICD-10-CM

## 2022-06-10 ENCOUNTER — Encounter (HOSPITAL_COMMUNITY): Payer: Self-pay

## 2022-06-10 ENCOUNTER — Ambulatory Visit (HOSPITAL_COMMUNITY): Admission: RE | Admit: 2022-06-10 | Payer: Medicare Other | Source: Ambulatory Visit

## 2022-06-11 ENCOUNTER — Ambulatory Visit (HOSPITAL_COMMUNITY)
Admission: RE | Admit: 2022-06-11 | Discharge: 2022-06-11 | Disposition: A | Discharge status: Home / Self Care | Payer: Medicare Other | Source: Ambulatory Visit | Attending: Adult Health Nurse Practitioner | Admitting: Adult Health Nurse Practitioner

## 2022-06-11 DIAGNOSIS — R928 Other abnormal and inconclusive findings on diagnostic imaging of breast: Secondary | ICD-10-CM | POA: Insufficient documentation

## 2022-06-11 DIAGNOSIS — R922 Inconclusive mammogram: Secondary | ICD-10-CM | POA: Diagnosis not present

## 2022-06-12 DIAGNOSIS — Z1211 Encounter for screening for malignant neoplasm of colon: Secondary | ICD-10-CM | POA: Diagnosis not present

## 2022-06-14 ENCOUNTER — Ambulatory Visit (HOSPITAL_COMMUNITY)
Admission: RE | Admit: 2022-06-14 | Discharge: 2022-06-14 | Disposition: A | Discharge status: Home / Self Care | Payer: Medicare Other | Source: Ambulatory Visit | Attending: Otolaryngology | Admitting: Otolaryngology

## 2022-06-14 DIAGNOSIS — M79671 Pain in right foot: Secondary | ICD-10-CM | POA: Diagnosis not present

## 2022-06-14 DIAGNOSIS — M25571 Pain in right ankle and joints of right foot: Secondary | ICD-10-CM | POA: Diagnosis not present

## 2022-06-14 DIAGNOSIS — R600 Localized edema: Secondary | ICD-10-CM | POA: Diagnosis not present

## 2022-06-20 LAB — COLOGUARD: COLOGUARD: NEGATIVE

## 2022-06-24 ENCOUNTER — Ambulatory Visit: Payer: Medicare Other | Admitting: Adult Health

## 2022-06-24 ENCOUNTER — Encounter: Payer: Self-pay | Admitting: Adult Health

## 2022-06-24 VITALS — BP 140/90 | HR 78 | Ht 62.0 in | Wt 127.5 lb

## 2022-06-24 DIAGNOSIS — R238 Other skin changes: Secondary | ICD-10-CM | POA: Diagnosis not present

## 2022-06-24 DIAGNOSIS — K649 Unspecified hemorrhoids: Secondary | ICD-10-CM | POA: Diagnosis not present

## 2022-06-24 DIAGNOSIS — L900 Lichen sclerosus et atrophicus: Secondary | ICD-10-CM | POA: Diagnosis not present

## 2022-06-24 DIAGNOSIS — M79671 Pain in right foot: Secondary | ICD-10-CM | POA: Diagnosis not present

## 2022-06-24 DIAGNOSIS — R102 Pelvic and perineal pain: Secondary | ICD-10-CM | POA: Diagnosis not present

## 2022-06-24 LAB — POCT URINALYSIS DIPSTICK
Blood, UA: NEGATIVE
Glucose, UA: NEGATIVE
Ketones, UA: NEGATIVE
Leukocytes, UA: NEGATIVE
Nitrite, UA: NEGATIVE
Protein, UA: NEGATIVE

## 2022-06-24 MED ORDER — VALACYCLOVIR HCL 1 G PO TABS: 1000.0000 mg | ORAL_TABLET | Freq: Two times a day (BID) | ORAL | 1 refills | Status: DC

## 2022-06-24 MED ORDER — CLOBETASOL PROPIONATE 0.05 % EX OINT: TOPICAL_OINTMENT | CUTANEOUS | 3 refills | Status: DC

## 2022-06-24 NOTE — Progress Notes (Signed)
  Subjective:     Patient ID: Margaret Cantu, female   DOB: 09/01/1956, 66 y.o.   MRN: [id number]  HPI Margaret Cantu is a 66 year old white female, widowed, PM in complaining of pressure in rectal area,low back stomach, has had some itching too, for about a week.  Last pap was negative HPV and malignancy 11/08/19.  PCP is C Keatts NP  Review of Systems +pressure in rectal area, back, stomach Has had itching  Reviewed past medical,surgical, social and family history. Reviewed medications and allergies.     Objective:   Physical Exam BP (!) 140/90 (BP Location: Left Arm, Patient Position: Sitting, Cuff Size: Normal)   Pulse 78   Ht 5\' 2"  (1.575 m)   Wt 127 lb 8 oz (57.8 kg)   BMI 23.32 kg/m     Urine dipstick is negative. Skin warm and dry. On rectal area has external hemorrhoid with several vesicles, that have dried, 2 linear cuts and white skin changes, on rectal exam, +hemorrhoids  HSV culture obtained. Fall risk is moderate  Upstream - 06/24/22 1407       Pregnancy Intention Screening   Does the patient want to become pregnant in the next year? N/A    Does the patient's partner want to become pregnant in the next year? N/A    Would the patient like to discuss contraceptive options today? N/A      Contraception Wrap Up   Current Method No Method - Other Reason   postmenopausal   End Method No Method - Other Reason   postmenopausal           Examination chaperoned by Levy Pupa LPN  Assessment:     1. Pelvic pressure in female Urine dipstick was negative  - POCT Urinalysis Dipstick  2. Hemorrhoids, unspecified hemorrhoid type Use Anusol or preparation H  Soak in warm warm or can use ice pack  3. Lichen sclerosus et atrophicus Has white areas at rectal area,like LSA Will refill temovate  Meds ordered this encounter  Medications   clobetasol ointment (TEMOVATE) 0.05 %    Sig: Take bid to affected area for 2 weeks then 2-3 x wekly    Dispense:  45 g     Refill:  3    Order Specific Question:   Supervising Provider    Answer:   Tania Ade H [[id number]]   valACYclovir (VALTREX) 1000 MG tablet    Sig: Take 1 tablet (1,000 mg total) by mouth 2 (two) times daily.    Dispense:  20 tablet    Refill:  1    Order Specific Question:   Supervising Provider    Answer:   Elonda Husky, LUTHER H [[id number]]     4. Vesicles +vesicle like areas on hemorrhoid, HSV culture obtained She is aware if not enough secretions obtained, culture will be negative  Will rx valtrex - Herpes simplex virus culture     Plan:     Follow up in 2 weeks

## 2022-06-27 ENCOUNTER — Telehealth: Payer: Self-pay | Admitting: Adult Health

## 2022-06-27 DIAGNOSIS — R238 Other skin changes: Secondary | ICD-10-CM

## 2022-06-27 LAB — HERPES SIMPLEX VIRUS CULTURE

## 2022-06-27 NOTE — Telephone Encounter (Signed)
Culture was + herpes,type 2 was treated with valtrex at visit. Hope you are feeling better

## 2022-07-08 ENCOUNTER — Ambulatory Visit: Payer: Medicare Other | Admitting: Adult Health

## 2022-07-08 ENCOUNTER — Encounter: Payer: Self-pay | Admitting: Adult Health

## 2022-07-08 VITALS — BP 138/92 | HR 77 | Ht 62.0 in | Wt 126.0 lb

## 2022-07-08 DIAGNOSIS — L900 Lichen sclerosus et atrophicus: Secondary | ICD-10-CM | POA: Diagnosis not present

## 2022-07-08 DIAGNOSIS — K649 Unspecified hemorrhoids: Secondary | ICD-10-CM

## 2022-07-08 DIAGNOSIS — B009 Herpesviral infection, unspecified: Secondary | ICD-10-CM

## 2022-07-08 NOTE — Progress Notes (Signed)
  Subjective:     Patient ID: Margaret Cantu, female   DOB: [birth year], 66 y.o.   MRN: [id number]  HPI Margaret Cantu is a 66 year old white female, widowed, PM following up on hemorrhoids and vesicles, that was +for herpes type 2.And is better.    PCP is Crosby Oyster NP  Review of Systems Has felt achy and had headache with valtrex.  Reviewed past medical,surgical, social and family history. Reviewed medications and allergies.     Objective:   Physical Exam BP (!) 138/92 (BP Location: Right Arm, Patient Position: Sitting, Cuff Size: Normal)   Pulse 77   Ht 5\' 2"  (1.575 m)   Wt 126 lb (57.2 kg)   BMI 23.05 kg/m     Skin warm and dry. On rectal area has external hemorrhoid, no vesicles noted.  Examination chaperoned by Levy Pupa LPN  Assessment:     1. Herpes Resolved   2. Hemorrhoids, unspecified hemorrhoid type Much better   3. Lichen sclerosus et atrophicus Continue temovate 2 x weekly     Plan:     Follow up prn

## 2022-07-18 ENCOUNTER — Ambulatory Visit: Payer: Medicare Other | Admitting: Adult Health

## 2022-07-18 ENCOUNTER — Encounter: Payer: Self-pay | Admitting: Adult Health

## 2022-07-18 VITALS — BP 157/96 | HR 81 | Ht 64.0 in | Wt 126.5 lb

## 2022-07-18 DIAGNOSIS — R140 Abdominal distension (gaseous): Secondary | ICD-10-CM | POA: Diagnosis not present

## 2022-07-18 DIAGNOSIS — R195 Other fecal abnormalities: Secondary | ICD-10-CM

## 2022-07-18 DIAGNOSIS — R198 Other specified symptoms and signs involving the digestive system and abdomen: Secondary | ICD-10-CM | POA: Diagnosis not present

## 2022-07-18 DIAGNOSIS — R102 Pelvic and perineal pain: Secondary | ICD-10-CM | POA: Diagnosis not present

## 2022-07-18 LAB — POCT URINALYSIS DIPSTICK
Blood, UA: NEGATIVE
Glucose, UA: NEGATIVE
Ketones, UA: NEGATIVE
Leukocytes, UA: NEGATIVE
Nitrite, UA: NEGATIVE
Protein, UA: NEGATIVE

## 2022-07-18 NOTE — Progress Notes (Signed)
  Subjective:     Patient ID: Margaret Cantu, female   DOB: November 18, 1955, 66 y.o.   MRN: [id number]  HPI Margaret Cantu is a 66 year old white female, widowd, PM in complaining of pelvic and rectal pressure on and off, may have happened about 3 x in last 5 weeks. Feels bloated, feels like has to pee and have BM and stool has changed in consistency, more soft and may be small at times. Denies any vaginal bleeding or rectal bleeding. No fever.  PCP is Ronny Bacon NP  Review of Systems pelvic and rectal pressure on and off, may have happened about 3 x in last 5 weeks. Feels bloated, feels like has to pee and have BM and stool has changed in consistency, more soft and may be small at times. Denies any vaginal bleeding or rectal bleeding. No fever.   Reviewed past medical,surgical, social and family history. Reviewed medications and allergies.  Objective:   Physical Exam BP (!) 157/96 (BP Location: Right Arm, Patient Position: Sitting, Cuff Size: Normal)   Pulse 81   Ht 5\' 4"  (1.626 m)   Wt 126 lb 8 oz (57.4 kg)   BMI 21.71 kg/m  urine dipstick was negative.   Skin warm and dry.Pelvic: external genitalia is normal in appearance no lesions, vagina: pale,urethra has no lesions or masses noted, cervix:smooth, uterus: normal size, shape and contour, non tender, no masses felt, adnexa: no masses or tenderness noted. Bladder is non tender and no masses felt. On rectal exam, good tone no masses felt. Examination chaperoned by LPN  Assessment:     1. Pelvic pressure in female +pressure at times, feels like needs to pee or have BM, not passing gas well Urine dipstick was negative Will get pelvic [name] to assess uterus and ovaries, scheduled for 07/25/22 at [facility] at 11:30 am  - POCT Urinalysis Dipstick - [facility] PELVIC COMPLETE WITH TRANSVAGINAL; Future  2. Rectal pressure +pressure at times   3. Change in consistency of stool More soft and smaller at times   4. Bloated abdomen Will get  pelvic US  - US PELVIC COMPLETE WITH TRANSVAGINAL; Future     Plan:     Will talk when results of [place] back, iwill get GI referral after [place] results back    Follow up TBD

## 2022-07-19 ENCOUNTER — Ambulatory Visit: Payer: Medicare Other | Admitting: Adult Health

## 2022-07-25 ENCOUNTER — Ambulatory Visit (HOSPITAL_COMMUNITY): Payer: Medicare Other

## 2022-10-07 DIAGNOSIS — U071 COVID-19: Secondary | ICD-10-CM | POA: Diagnosis not present

## 2022-10-07 DIAGNOSIS — R110 Nausea: Secondary | ICD-10-CM | POA: Diagnosis not present

## 2022-11-13 DIAGNOSIS — L298 Other pruritus: Secondary | ICD-10-CM | POA: Diagnosis not present

## 2022-11-13 DIAGNOSIS — R21 Rash and other nonspecific skin eruption: Secondary | ICD-10-CM | POA: Diagnosis not present

## 2022-11-13 DIAGNOSIS — L282 Other prurigo: Secondary | ICD-10-CM | POA: Diagnosis not present

## 2022-11-14 DIAGNOSIS — L299 Pruritus, unspecified: Secondary | ICD-10-CM | POA: Diagnosis not present

## 2022-11-14 DIAGNOSIS — R21 Rash and other nonspecific skin eruption: Secondary | ICD-10-CM | POA: Diagnosis not present

## 2022-11-18 DIAGNOSIS — L298 Other pruritus: Secondary | ICD-10-CM | POA: Diagnosis not present

## 2022-11-18 DIAGNOSIS — L282 Other prurigo: Secondary | ICD-10-CM | POA: Diagnosis not present

## 2022-11-18 DIAGNOSIS — W57XXXD Bitten or stung by nonvenomous insect and other nonvenomous arthropods, subsequent encounter: Secondary | ICD-10-CM | POA: Diagnosis not present

## 2022-11-18 DIAGNOSIS — R21 Rash and other nonspecific skin eruption: Secondary | ICD-10-CM | POA: Diagnosis not present

## 2022-11-18 DIAGNOSIS — M545 Low back pain, unspecified: Secondary | ICD-10-CM | POA: Diagnosis not present

## 2022-11-22 DIAGNOSIS — H25043 Posterior subcapsular polar age-related cataract, bilateral: Secondary | ICD-10-CM | POA: Diagnosis not present

## 2022-11-28 ENCOUNTER — Encounter: Payer: Self-pay | Admitting: Adult Health

## 2022-11-28 ENCOUNTER — Ambulatory Visit: Payer: Medicare Other | Admitting: Adult Health

## 2022-11-28 VITALS — BP 132/86 | HR 74 | Ht 61.0 in | Wt 127.0 lb

## 2022-11-28 DIAGNOSIS — K649 Unspecified hemorrhoids: Secondary | ICD-10-CM

## 2022-11-28 DIAGNOSIS — R3915 Urgency of urination: Secondary | ICD-10-CM | POA: Diagnosis not present

## 2022-11-28 DIAGNOSIS — L989 Disorder of the skin and subcutaneous tissue, unspecified: Secondary | ICD-10-CM | POA: Diagnosis not present

## 2022-11-28 DIAGNOSIS — L900 Lichen sclerosus et atrophicus: Secondary | ICD-10-CM | POA: Diagnosis not present

## 2022-11-28 LAB — POCT URINALYSIS DIPSTICK
Blood, UA: NEGATIVE
Glucose, UA: NEGATIVE
Leukocytes, UA: NEGATIVE
Nitrite, UA: NEGATIVE
Protein, UA: NEGATIVE

## 2022-11-28 NOTE — Progress Notes (Signed)
  Subjective:     Patient ID: Margaret Cantu, female   DOB: Sep 05, 1956, 67 y.o.   MRN: [id number]  HPI Margaret Cantu is a 67 year old white female, widowed, PM in complaining of rash in vaginal/anal area, has urgency and feels like something coming out rectum. Has  bug bites RLQ, has seen Dr Nevada Crane, and got a shot.   PCP is Crosby Oyster NP  Review of Systems Has rash in vaginal/anal area Has bites RLQ has seen Dr [place] Crane. Has some urinary urgency Feels like something coming from rectum  Reviewed past medical,surgical, social and family history. Reviewed medications and allergies.     Objective:   Physical Exam BP 132/86 (BP Location: Right Arm, Patient Position: Sitting, Cuff Size: Normal)   Pulse 74   Ht 5\' 1"  (1.549 m)   Wt 127 lb (57.6 kg)   BMI 24.00 kg/m  urine dipstick was negative    Skin warm and dry. Has several red spots RLQ.Pelvic: external genitalia is normal in appearance, has thickened skin near clitoris, has mild redness labia and around anal area, vagina is pale, urethra has no lesions or masses noted, cervix:smooth, uterus: normal size, shape and contour, non tender, no masses felt, adnexa: no masses or tenderness noted. Bladder is non tender and no masses felt. Fall risk is low  Upstream - 11/28/22 1209       Pregnancy Intention Screening   Does the patient want to become pregnant in the next year? N/A    Does the patient's partner want to become pregnant in the next year? N/A    Would the patient like to discuss contraceptive options today? N/A      Contraception Wrap Up   Current Method No Method - Other Reason   postmenopausal   Reason for No Current Contraceptive Method at Intake ([facility] Only) Other    End Method No Method - Other Reason   postmenopausal   Contraception Counseling Provided No            Examination chaperoned by Levy Pupa LPN  Assessment:     1. Lichen sclerosus et atrophicus Use temovate every day for few days then 2 x weekly, has refills    2. Hemorrhoids, unspecified hemorrhoid type Use preparation H   3. Perineal irritation in female Try aquaphor 3N 1 cream in peri area   4. Urinary urgency Urine negative     Plan:     Follow up prn

## 2022-12-03 DIAGNOSIS — X32XXXD Exposure to sunlight, subsequent encounter: Secondary | ICD-10-CM | POA: Diagnosis not present

## 2022-12-03 DIAGNOSIS — L570 Actinic keratosis: Secondary | ICD-10-CM | POA: Diagnosis not present

## 2022-12-16 DIAGNOSIS — H25812 Combined forms of age-related cataract, left eye: Secondary | ICD-10-CM | POA: Diagnosis not present

## 2022-12-20 DIAGNOSIS — H269 Unspecified cataract: Secondary | ICD-10-CM | POA: Diagnosis not present

## 2022-12-20 DIAGNOSIS — H25812 Combined forms of age-related cataract, left eye: Secondary | ICD-10-CM | POA: Diagnosis not present

## 2022-12-24 ENCOUNTER — Ambulatory Visit: Payer: Medicare Other | Admitting: Adult Health

## 2023-03-05 DIAGNOSIS — H25811 Combined forms of age-related cataract, right eye: Secondary | ICD-10-CM | POA: Diagnosis not present

## 2023-04-07 DIAGNOSIS — Z6823 Body mass index (BMI) 23.0-23.9, adult: Secondary | ICD-10-CM | POA: Diagnosis not present

## 2023-04-07 DIAGNOSIS — I10 Essential (primary) hypertension: Secondary | ICD-10-CM | POA: Diagnosis not present

## 2023-04-07 DIAGNOSIS — E782 Mixed hyperlipidemia: Secondary | ICD-10-CM | POA: Diagnosis not present

## 2023-04-07 DIAGNOSIS — E119 Type 2 diabetes mellitus without complications: Secondary | ICD-10-CM | POA: Diagnosis not present

## 2023-04-07 DIAGNOSIS — R110 Nausea: Secondary | ICD-10-CM | POA: Diagnosis not present

## 2023-04-07 DIAGNOSIS — J019 Acute sinusitis, unspecified: Secondary | ICD-10-CM | POA: Diagnosis not present

## 2023-04-11 ENCOUNTER — Other Ambulatory Visit (HOSPITAL_COMMUNITY): Payer: Self-pay | Admitting: Family Medicine

## 2023-04-11 DIAGNOSIS — I10 Essential (primary) hypertension: Secondary | ICD-10-CM | POA: Diagnosis not present

## 2023-04-11 DIAGNOSIS — R7401 Elevation of levels of liver transaminase levels: Secondary | ICD-10-CM | POA: Diagnosis not present

## 2023-04-11 DIAGNOSIS — L299 Pruritus, unspecified: Secondary | ICD-10-CM | POA: Diagnosis not present

## 2023-04-11 DIAGNOSIS — Z6823 Body mass index (BMI) 23.0-23.9, adult: Secondary | ICD-10-CM | POA: Diagnosis not present

## 2023-04-11 DIAGNOSIS — M8580 Other specified disorders of bone density and structure, unspecified site: Secondary | ICD-10-CM

## 2023-04-11 DIAGNOSIS — Z7182 Exercise counseling: Secondary | ICD-10-CM | POA: Diagnosis not present

## 2023-04-11 DIAGNOSIS — Z713 Dietary counseling and surveillance: Secondary | ICD-10-CM | POA: Diagnosis not present

## 2023-04-11 DIAGNOSIS — E119 Type 2 diabetes mellitus without complications: Secondary | ICD-10-CM | POA: Diagnosis not present

## 2023-04-11 DIAGNOSIS — L309 Dermatitis, unspecified: Secondary | ICD-10-CM | POA: Diagnosis not present

## 2023-04-11 DIAGNOSIS — E782 Mixed hyperlipidemia: Secondary | ICD-10-CM | POA: Diagnosis not present

## 2023-04-18 ENCOUNTER — Ambulatory Visit (INDEPENDENT_AMBULATORY_CARE_PROVIDER_SITE_OTHER): Payer: Medicare Other | Admitting: Adult Health

## 2023-04-18 ENCOUNTER — Ambulatory Visit (HOSPITAL_COMMUNITY)
Admission: RE | Admit: 2023-04-18 | Discharge: 2023-04-18 | Disposition: A | Discharge status: Home / Self Care | Payer: Medicare Other | Source: Ambulatory Visit | Attending: Adult Health | Admitting: Adult Health

## 2023-04-18 ENCOUNTER — Encounter: Payer: Self-pay | Admitting: Adult Health

## 2023-04-18 ENCOUNTER — Other Ambulatory Visit (HOSPITAL_COMMUNITY)
Admission: RE | Admit: 2023-04-18 | Discharge: 2023-04-18 | Disposition: A | Discharge status: Home / Self Care | Payer: Medicare Other | Source: Ambulatory Visit | Attending: Adult Health | Admitting: Adult Health

## 2023-04-18 VITALS — BP 149/87 | HR 64 | Ht 60.0 in | Wt 125.0 lb

## 2023-04-18 DIAGNOSIS — Z1211 Encounter for screening for malignant neoplasm of colon: Secondary | ICD-10-CM

## 2023-04-18 DIAGNOSIS — Z01419 Encounter for gynecological examination (general) (routine) without abnormal findings: Secondary | ICD-10-CM | POA: Insufficient documentation

## 2023-04-18 DIAGNOSIS — R0781 Pleurodynia: Secondary | ICD-10-CM | POA: Diagnosis not present

## 2023-04-18 DIAGNOSIS — Z1151 Encounter for screening for human papillomavirus (HPV): Secondary | ICD-10-CM | POA: Diagnosis not present

## 2023-04-18 LAB — HEMOCCULT GUIAC POC 1CARD (OFFICE): Fecal Occult Blood, POC: NEGATIVE

## 2023-04-18 NOTE — Progress Notes (Signed)
Patient ID: Margaret Cantu, female   DOB: [birth year], 67 y.o.   MRN: [id number] History of Present Illness: Margaret Cantu is a 67 year old white female, widowed, but engaged and getting married 08/22/23, PM, in for a well woman gyn exam and pap. She has pain in right ribs for 3 days after cleaned bath rub and leaning on edge of tub.  PCP is Ronny Bacon NP   Current Medications, Allergies, Past Medical History, Past Surgical History, Family History and Social History were reviewed in [employer] record.     Review of Systems: Patient denies any headaches, hearing loss, fatigue, blurred vision, shortness of breath, chest pain, abdominal pain, problems with bowel movements, urination, or intercourse. No joint pain or mood swings.  Pain in right ribs.   Physical Exam:BP (!) 149/87 (BP Location: Left Arm, Patient Position: Sitting, Cuff Size: Normal)   Pulse 64   Ht 5' (1.524 m)   Wt 125 lb (56.7 kg)   BMI 24.41 kg/m   General:  Well developed, well nourished, no acute distress Skin:  Warm and dry Neck:  Midline trachea, normal thyroid, good ROM, no lymphadenopathy, no carotid bruits heard Lungs; Clear to auscultation bilaterally Breast:  No dominant palpable mass, retraction, or nipple discharge, she is tender over right ribs Cardiovascular: Regular rate and rhythm Abdomen:  Soft, non tender, no hepatosplenomegaly Pelvic:  External genitalia is normal in appearance, no lesions.  The vagina is pale. Urethra has no lesions or masses. The cervix is smooth, pap with HR HPV genotyping performed.  Uterus is felt to be normal size, shape, and contour.  No adnexal masses or tenderness noted.Bladder is non tender, no masses felt. Rectal: Good sphincter tone, no polyps, or hemorrhoids felt.  Hemoccult negative. Extremities/musculoskeletal:  No swelling or varicosities noted, no clubbing or cyanosis Psych:  No mood changes, alert and cooperative,seems happy AA is 0    04/18/2023     8:38 AM 12/18/2021    1:46 PM 12/08/2020    1:00 PM  Depression screen PHQ 2/9  Decreased Interest 0 0 0  Down, Depressed, Hopeless 0 1   PHQ - 2 Score 0 1 0  Altered sleeping 3 3 1   Tired, decreased energy 1 3 0  Change in appetite 0 0 0  Feeling bad or failure about yourself  0 0 0  Trouble concentrating 0 3 0  Moving slowly or fidgety/restless 0 0 0  Suicidal thoughts 0 0 0  PHQ-9 Score 4 10 1        04/18/2023    8:38 AM 12/08/2020    1:00 PM  GAD 7 : Generalized Anxiety Score  Nervous, Anxious, on Edge 3 3  Control/stop worrying 3 3  Worry too much - different things 3 3  Trouble relaxing 3 3  Restless 3 3  Easily annoyed or irritable 3 3  Afraid - awful might happen 3 3  Total GAD 7 Score 21 21  She has xanax prn    Examination chaperoned by Faith Rogue LPN     Impression and Plan: 1. Encounter for gynecological examination with Papanicolaou smear of cervix Pap sent Last pap if normal Physical in 1 year Labs with PCP Mammogram was negative 06/11/22, had asymmetry 05/22/22. Cologuard was negative 06/12/22.   - Cytology - PAP( [facility])  2. Encounter for screening fecal occult blood testing Hemoccult was negative  - POCT occult blood stool  3. Rib pain on right side Pain right ribs for  3 days after leaning on tub Will get xray   - DG Ribs Unilateral Right; Future

## 2023-04-23 ENCOUNTER — Encounter: Payer: Self-pay | Admitting: Adult Health

## 2023-04-23 DIAGNOSIS — R8789 Other abnormal findings in specimens from female genital organs: Secondary | ICD-10-CM | POA: Insufficient documentation

## 2023-05-27 DIAGNOSIS — Z0000 Encounter for general adult medical examination without abnormal findings: Secondary | ICD-10-CM | POA: Diagnosis not present

## 2023-06-19 ENCOUNTER — Other Ambulatory Visit (HOSPITAL_COMMUNITY): Payer: Self-pay | Admitting: Family Medicine

## 2023-06-19 DIAGNOSIS — N6489 Other specified disorders of breast: Secondary | ICD-10-CM

## 2023-07-01 DIAGNOSIS — H10811 Pingueculitis, right eye: Secondary | ICD-10-CM | POA: Diagnosis not present

## 2023-07-15 DIAGNOSIS — R7301 Impaired fasting glucose: Secondary | ICD-10-CM | POA: Diagnosis not present

## 2023-07-15 DIAGNOSIS — R7401 Elevation of levels of liver transaminase levels: Secondary | ICD-10-CM | POA: Diagnosis not present

## 2023-07-15 DIAGNOSIS — E782 Mixed hyperlipidemia: Secondary | ICD-10-CM | POA: Diagnosis not present

## 2023-07-16 DIAGNOSIS — H6593 Unspecified nonsuppurative otitis media, bilateral: Secondary | ICD-10-CM | POA: Diagnosis not present

## 2023-07-16 DIAGNOSIS — H6503 Acute serous otitis media, bilateral: Secondary | ICD-10-CM | POA: Diagnosis not present

## 2023-07-17 ENCOUNTER — Ambulatory Visit (HOSPITAL_COMMUNITY)
Admission: RE | Admit: 2023-07-17 | Discharge: 2023-07-17 | Disposition: A | Discharge status: Home / Self Care | Payer: Medicare Other | Source: Ambulatory Visit | Attending: Family Medicine | Admitting: Family Medicine

## 2023-07-17 ENCOUNTER — Encounter (HOSPITAL_COMMUNITY): Payer: Self-pay

## 2023-07-17 DIAGNOSIS — R92333 Mammographic heterogeneous density, bilateral breasts: Secondary | ICD-10-CM | POA: Diagnosis not present

## 2023-07-17 DIAGNOSIS — N6489 Other specified disorders of breast: Secondary | ICD-10-CM

## 2023-07-17 DIAGNOSIS — R928 Other abnormal and inconclusive findings on diagnostic imaging of breast: Secondary | ICD-10-CM | POA: Diagnosis not present

## 2023-07-21 DIAGNOSIS — I10 Essential (primary) hypertension: Secondary | ICD-10-CM | POA: Diagnosis not present

## 2023-07-21 DIAGNOSIS — L299 Pruritus, unspecified: Secondary | ICD-10-CM | POA: Diagnosis not present

## 2023-07-21 DIAGNOSIS — Z0001 Encounter for general adult medical examination with abnormal findings: Secondary | ICD-10-CM | POA: Diagnosis not present

## 2023-07-21 DIAGNOSIS — E119 Type 2 diabetes mellitus without complications: Secondary | ICD-10-CM | POA: Diagnosis not present

## 2023-07-21 DIAGNOSIS — Z79899 Other long term (current) drug therapy: Secondary | ICD-10-CM | POA: Diagnosis not present

## 2023-07-21 DIAGNOSIS — Z0000 Encounter for general adult medical examination without abnormal findings: Secondary | ICD-10-CM | POA: Diagnosis not present

## 2023-07-21 DIAGNOSIS — L309 Dermatitis, unspecified: Secondary | ICD-10-CM | POA: Diagnosis not present

## 2023-07-21 DIAGNOSIS — Z7984 Long term (current) use of oral hypoglycemic drugs: Secondary | ICD-10-CM | POA: Diagnosis not present

## 2023-07-21 DIAGNOSIS — E782 Mixed hyperlipidemia: Secondary | ICD-10-CM | POA: Diagnosis not present

## 2023-07-21 DIAGNOSIS — M8580 Other specified disorders of bone density and structure, unspecified site: Secondary | ICD-10-CM | POA: Diagnosis not present

## 2023-08-04 ENCOUNTER — Encounter: Payer: Self-pay | Admitting: Adult Health

## 2023-08-04 ENCOUNTER — Ambulatory Visit: Payer: Medicare Other | Admitting: Adult Health

## 2023-08-04 VITALS — BP 142/83 | HR 58 | Ht 60.0 in | Wt 124.0 lb

## 2023-08-04 DIAGNOSIS — L292 Pruritus vulvae: Secondary | ICD-10-CM | POA: Insufficient documentation

## 2023-08-04 DIAGNOSIS — R350 Frequency of micturition: Secondary | ICD-10-CM | POA: Insufficient documentation

## 2023-08-04 DIAGNOSIS — L900 Lichen sclerosus et atrophicus: Secondary | ICD-10-CM | POA: Diagnosis not present

## 2023-08-04 LAB — POCT URINALYSIS DIPSTICK
Blood, UA: NEGATIVE
Glucose, UA: NEGATIVE
Nitrite, UA: NEGATIVE
Protein, UA: NEGATIVE

## 2023-08-04 MED ORDER — NYSTATIN 100000 UNIT/GM EX OINT: 1.0000 | TOPICAL_OINTMENT | Freq: Two times a day (BID) | CUTANEOUS | 3 refills | Status: AC

## 2023-08-04 NOTE — Progress Notes (Signed)
  Subjective:     Patient ID: Margaret Cantu, female   DOB: 02-05-1956, 67 y.o.   MRN: [id number]  HPI Margaret Cantu is a 67 year old white female, widowed, but getting married 08/22/23, PM, in complaining of itching and burning in vulva area on and off for a month, but as been good last 2 days. Sometimes feels like tiny cuts and has had some urinary frequency.      Component Value Date/Time   DIAGPAP  04/18/2023 0839    - Negative for intraepithelial lesion or malignancy (NILM)   DIAGPAP  11/08/2019 1346    - Negative for intraepithelial lesion or malignancy (NILM)   DIAGPAP  07/30/2016 0000    NEGATIVE FOR INTRAEPITHELIAL LESIONS OR MALIGNANCY.   HPVHIGH Positive (A) 04/18/2023 0839   HPVHIGH Negative 11/08/2019 1346   ADEQPAP  04/18/2023 0839    Satisfactory for evaluation; transformation zone component PRESENT.   ADEQPAP  11/08/2019 1346    Satisfactory for evaluation; transformation zone component PRESENT.   ADEQPAP  07/30/2016 0000    Satisfactory for evaluation  endocervical/transformation zone component PRESENT.    PCP is C Keatts NP  Review of Systems +itching and burning in vulva area +urinary frequency    No sex in over a month Reviewed past medical,surgical, social and family history. Reviewed medications and allergies.  Objective:   Physical Exam BP (!) 142/83 (BP Location: Left Arm, Patient Position: Sitting, Cuff Size: Normal)   Pulse (!) 58   Ht 5' (1.524 m)   Wt 124 lb (56.2 kg)   BMI 24.22 kg/m  urine dipstick trace leuks Skin warm and dry.Pelvic: external genitalia is normal in appearance, has resolving hair bump left labia, vagina: pale pink,skin thin at introitus,urethra has no lesions or masses noted, cervix:smooth, uterus: normal size, shape and contour, non tender, no masses felt, adnexa: no masses or tenderness noted. Bladder is non tender and no masses felt.    Pt gave verbal consent for exam without chaperone.  Assessment:     1. Vulvar  itching Better now, but will rx nystatin ointment bid prn Meds ordered this encounter  Medications   nystatin ointment (MYCOSTATIN)    Sig: Apply 1 Application topically 2 (two) times daily.    Dispense:  30 g    Refill:  3    Order Specific Question:   Supervising Provider    Answer:   Despina Hidden, LUTHER H [[id number]]     2. Lichen sclerosus et atrophicus Use temovate 2-3 x weekly, has some at home  3. Urinary frequency Increase water and decrease caffeine     Plan:     Follow up prn

## 2023-11-01 ENCOUNTER — Emergency Department (HOSPITAL_COMMUNITY)
Admission: EM | Admit: 2023-11-01 | Discharge: 2023-11-01 | Disposition: A | Discharge status: Home / Self Care | Payer: Medicare Other | Attending: Emergency Medicine | Admitting: Emergency Medicine

## 2023-11-01 ENCOUNTER — Emergency Department (HOSPITAL_COMMUNITY): Payer: Medicare Other

## 2023-11-01 ENCOUNTER — Other Ambulatory Visit: Payer: Self-pay

## 2023-11-01 DIAGNOSIS — S52571A Other intraarticular fracture of lower end of right radius, initial encounter for closed fracture: Secondary | ICD-10-CM | POA: Diagnosis not present

## 2023-11-01 DIAGNOSIS — S52251A Displaced comminuted fracture of shaft of ulna, right arm, initial encounter for closed fracture: Secondary | ICD-10-CM | POA: Diagnosis not present

## 2023-11-01 DIAGNOSIS — Z7984 Long term (current) use of oral hypoglycemic drugs: Secondary | ICD-10-CM | POA: Insufficient documentation

## 2023-11-01 DIAGNOSIS — S52611A Displaced fracture of right ulna styloid process, initial encounter for closed fracture: Secondary | ICD-10-CM | POA: Diagnosis not present

## 2023-11-01 DIAGNOSIS — S52501A Unspecified fracture of the lower end of right radius, initial encounter for closed fracture: Secondary | ICD-10-CM | POA: Diagnosis not present

## 2023-11-01 DIAGNOSIS — W19XXXA Unspecified fall, initial encounter: Secondary | ICD-10-CM | POA: Insufficient documentation

## 2023-11-01 DIAGNOSIS — M25531 Pain in right wrist: Secondary | ICD-10-CM | POA: Diagnosis not present

## 2023-11-01 DIAGNOSIS — E119 Type 2 diabetes mellitus without complications: Secondary | ICD-10-CM | POA: Diagnosis not present

## 2023-11-01 DIAGNOSIS — S52511A Displaced fracture of right radial styloid process, initial encounter for closed fracture: Secondary | ICD-10-CM | POA: Diagnosis not present

## 2023-11-01 DIAGNOSIS — S52001A Unspecified fracture of upper end of right ulna, initial encounter for closed fracture: Secondary | ICD-10-CM | POA: Diagnosis not present

## 2023-11-01 DIAGNOSIS — S52601A Unspecified fracture of lower end of right ulna, initial encounter for closed fracture: Secondary | ICD-10-CM | POA: Diagnosis not present

## 2023-11-01 DIAGNOSIS — S52351A Displaced comminuted fracture of shaft of radius, right arm, initial encounter for closed fracture: Secondary | ICD-10-CM | POA: Diagnosis not present

## 2023-11-01 DIAGNOSIS — S52501D Unspecified fracture of the lower end of right radius, subsequent encounter for closed fracture with routine healing: Secondary | ICD-10-CM | POA: Diagnosis not present

## 2023-11-01 DIAGNOSIS — S52351D Displaced comminuted fracture of shaft of radius, right arm, subsequent encounter for closed fracture with routine healing: Secondary | ICD-10-CM | POA: Diagnosis not present

## 2023-11-01 MED ORDER — OXYCODONE-ACETAMINOPHEN 5-325 MG PO TABS: 1.0000 | ORAL_TABLET | Freq: Three times a day (TID) | ORAL | 0 refills | Status: DC | PRN

## 2023-11-01 MED ORDER — FENTANYL CITRATE PF 50 MCG/ML IJ SOSY
50.0000 ug | PREFILLED_SYRINGE | Freq: Once | INTRAMUSCULAR | Status: AC
  Administered 2023-11-01: 50 ug via INTRAVENOUS
  Filled 2023-11-01: qty 1

## 2023-11-01 MED ORDER — BUPIVACAINE HCL (PF) 0.5 % IJ SOLN
10.0000 mL | Freq: Once | INTRAMUSCULAR | Status: AC
  Administered 2023-11-01: 10 mL
  Filled 2023-11-01: qty 30

## 2023-11-01 NOTE — ED Provider Notes (Signed)
 [place] EMERGENCY DEPARTMENT AT [facility] Provider Note   CSN: [id number] Arrival date & time: 11/01/23  4098     History  Chief Complaint  Patient presents with   Wrist Pain    Margaret Cantu is a 68 y.o. female.   Wrist Pain  Patient with right wrist pain.  Larey Seat last night while being on a hover board.  Has deformity and pain.  No other injury.  Has not eaten breakfast today.    Past Medical History:  Diagnosis Date   Anxiety    Diabetes mellitus without complication (HCC)    pre-diabetes   Gallstones    Herpes simplex virus (HSV) infection    06/2022   Hyperlipidemia    Migraine    Renal disorder    kidney stones    Home Medications Prior to Admission medications   Medication Sig Start Date End Date Taking? Authorizing Provider  oxyCODONE-acetaminophen (PERCOCET/ROXICET) 5-325 MG tablet Take 1-2 tablets by mouth every 8 (eight) hours as needed for severe pain (pain score 7-10). 11/01/23  Yes Benjiman Core, MD  ALPRAZolam Prudy Feeler) 1 MG tablet Take 1 mg by mouth at bedtime as needed for anxiety.    [provider]  atenolol (TENORMIN) 50 MG tablet Take 50 mg by mouth at bedtime. 11/27/20   [provider]  clobetasol ointment (TEMOVATE) 0.05 % Take bid to affected area for 2 weeks then 2-3 x wekly 06/24/22   Adline Potter, NP  hydrochlorothiazide (HYDRODIURIL) 25 MG tablet Take 25 mg by mouth. Patient not taking: Reported on 08/04/2023 10/05/19   [provider]  metFORMIN (GLUCOPHAGE) 500 MG tablet TAKE ONE TABLET BY MOUTH ONCE DAILY 07/24/16   [provider]  Multiple Vitamin (MULTIVITAMIN) tablet Take 1 tablet by mouth daily.    [provider]  nystatin ointment (MYCOSTATIN) Apply 1 Application topically 2 (two) times daily. 08/04/23   Adline Potter, NP  ondansetron (ZOFRAN) 4 MG tablet Take 1 tablet (4 mg total) by mouth every 6 (six) hours. 03/31/19   Triplett, Tammy, PA-C   rosuvastatin (CRESTOR) 10 MG tablet Take 10 mg by mouth daily.    [provider]  traZODone (DESYREL) 100 MG tablet Take 100 mg by mouth 2 (two) times daily. Patient not taking: Reported on 08/04/2023 07/07/23   [provider]      Allergies    Hydrocodone    Review of Systems   Review of Systems  Physical Exam Updated Vital Signs BP (!) 160/88 (BP Location: Left Arm)   Pulse 86   Temp 98.2 F (36.8 C) (Oral)   Resp 16   Ht 5' (1.524 m)   Wt 56.7 kg   SpO2 100%   BMI 24.41 kg/m  Physical Exam Vitals and nursing note reviewed.  Cardiovascular:     Rate and Rhythm: Regular rhythm.  Musculoskeletal:     Comments: Edema on the right wrist and particular dorsum of hand.  Sensation grossly intact over hand.  Skin intact.  Swelling and tenderness of wrist.  Neurological:     Mental Status: She is alert.     ED Results / Procedures / Treatments   Labs (all labs ordered are listed, but only abnormal results are displayed) Labs Reviewed - No data to display  EKG None  Radiology DG Wrist Complete Right Result Date: 11/01/2023 CLINICAL DATA:  Status post reduction of distal radial fracture EXAM: RIGHT WRIST - COMPLETE 3 VIEW COMPARISON:  Same-day radiograph of  the wrist FINDINGS: Overlying cast obscures fine detail. Interval reduction of the comminuted distal radial fracture in improved near anatomic alignment. Similar minimally displaced ulnar styloid avulsion fracture. IMPRESSION: 1. Interval reduction of the comminuted distal radial fracture in improved near anatomic alignment. 2. Similar minimally displaced ulnar styloid avulsion fracture. Electronically Signed   By: Agustin Cree M.D.   On: 11/01/2023 09:53   DG Wrist Complete Right Result Date: 11/01/2023 CLINICAL DATA:  Right wrist pain after fall last night. EXAM: RIGHT WRIST - COMPLETE 3+ VIEW COMPARISON:  None Available. FINDINGS: Severely displaced and comminuted fracture is seen involving the distal  right radius with probable intra-articular extension and posterior displacement of distal fragments. Moderately displaced ulnar styloid fracture is noted. IMPRESSION: Severely displaced and comminuted distal right radial fracture with probable intra-articular extension and posterior displacement of distal fragments. Electronically Signed   By: Lupita Raider M.D.   On: 11/01/2023 08:42    Procedures .Ortho Injury Treatment  Date/Time: 11/01/2023 7:30 PM  Performed by: Benjiman Core, MD Authorized by: Benjiman Core, MD   Consent:    Consent obtained:  Verbal   Consent given by:  Patient   Risks discussed:  Fracture, nerve damage, restricted joint movement, vascular damage and stiffnessInjury location: wrist Location details: right wrist Injury type: fracture Fracture type: distal radius and ulnar styloid Pre-procedure neurovascular assessment: neurovascularly intact Pre-procedure distal perfusion: normal Pre-procedure neurological function: normal Pre-procedure range of motion: reduced Anesthesia: hematoma block  Anesthesia: Local anesthesia used: yes Local Anesthetic: bupivacaine 0.5% without epinephrine Anesthetic total: 5 mL  Patient sedated: NoManipulation performed: yes Skin traction used: yes (Finger traps used initially) Skeletal traction used: no Reduction successful: yes X-ray confirmed reduction: yes Immobilization: splint Splint type: sugar tong Splint Applied by: ED Nurse, ED Provider and ED Tech Supplies used: Ortho-Glass Post-procedure neurovascular assessment: post-procedure neurovascularly intact Post-procedure distal perfusion: normal Post-procedure neurological function: normal       Medications Ordered in ED Medications  bupivacaine(PF) (MARCAINE) 0.5 % injection 10 mL (10 mLs Infiltration Given by Other 11/01/23 0906)  fentaNYL (SUBLIMAZE) injection 50 mcg (50 mcg Intravenous Given 11/01/23 1610)    ED Course/ Medical Decision Making/ A&P                                  Medical Decision Making Amount and/or Complexity of Data Reviewed Radiology: ordered.  Risk Prescription drug management.   Patient with fall.  No tenderness over elbow or shoulder.  No other injury besides likely right wrist fracture.  Will get x-ray to evaluate.  X-ray does show comminuted displaced distal radius fracture.  Also ulnar styloid fracture.  Independently by me.  Discussed with Dr. Dallas Schimke.  Will attempt reduction in ER.  Reduction done with improved alignment.  CT scan done for outpatient management.  Follow-up as an outpatient with pain medicine.        Final Clinical Impression(s) / ED Diagnoses Final diagnoses:  Closed fracture of distal end of right radius, unspecified fracture morphology, initial encounter    Rx / DC Orders ED Discharge Orders          Ordered    oxyCODONE-acetaminophen (PERCOCET/ROXICET) 5-325 MG tablet  Every 8 hours PRN        11/01/23 1034              Benjiman Core, MD 11/01/23 1037

## 2023-11-01 NOTE — ED Triage Notes (Signed)
 Pt fell last night onto her right wrist from a hoover board. Deformity obviously noted.

## 2023-11-04 ENCOUNTER — Encounter: Payer: Self-pay | Admitting: Orthopedic Surgery

## 2023-11-04 ENCOUNTER — Ambulatory Visit: Payer: Medicare Other | Admitting: Orthopedic Surgery

## 2023-11-04 VITALS — Ht 60.0 in | Wt 127.0 lb

## 2023-11-04 DIAGNOSIS — S52611A Displaced fracture of right ulna styloid process, initial encounter for closed fracture: Secondary | ICD-10-CM | POA: Diagnosis not present

## 2023-11-04 DIAGNOSIS — S52531A Colles' fracture of right radius, initial encounter for closed fracture: Secondary | ICD-10-CM | POA: Diagnosis not present

## 2023-11-04 MED ORDER — DIPHENHYDRAMINE HCL 25 MG PO CAPS: 25.0000 mg | ORAL_CAPSULE | Freq: Four times a day (QID) | ORAL | 0 refills | Status: DC | PRN

## 2023-11-04 MED ORDER — HYDROMORPHONE HCL 2 MG PO TABS: 1.0000 mg | ORAL_TABLET | Freq: Four times a day (QID) | ORAL | 0 refills | Status: DC | PRN

## 2023-11-04 NOTE — Progress Notes (Signed)
 New Patient Visit  Assessment: Margaret Cantu is a 68 y.o. female with the following: 1. Closed Colles' fracture of right radius, initial encounter 2.  Displaced ulnar styloid fracture  Plan: Margaret Cantu fell and landed on an outstretched right arm, just a few days ago.  She sustained a comminuted distal radius fracture with dorsal impaction, and angulation.  This was reduced in the emergency department, with an excellent overall reduction.  I do have concerns that the fracture will gradually subside.  We had an extensive discussion in clinic today in regards to operative and nonoperative management.  She would prefer to try nonoperative management.  I think this is reasonable.  She is aware that she may lose some strength and range of motion in her wrist, and also may develop a residual deformity.  She accepts these risks.  I would like to keep a close eye on her injury, and see her back in 1 week with repeat x-rays.  Once again, we will discuss operative and nonoperative management depending on those x-rays.  I would not plan to transition her to a cast for at least 2 more weeks.  She has had issues with oxycodone and hydrocodone, so I have provided her with a limited prescription of Dilaudid, in hopes of reducing the side effect profile.  She is also requested some Benadryl as these medicines cause some itching.  Elevate the hand to help with swelling.  Continue to use the sling for comfort.  Follow-up in 1 week for repeat x-rays.  Follow-up: Return in about 1 week (around 11/11/2023).  Subjective:  Chief Complaint  Patient presents with   Fracture    R wrist DOI 11/01/23    History of Present Illness: Margaret Cantu is a 68 y.o. female who presents for evaluation of right wrist pain.  She is right-hand dominant.  She was taking care of her grandchildren, and attempted to use a hover board.  She lost her balance.  She landed on outstretched right wrist.  She continued to take  care of her grandchildren, and presented to the emergency department the next day.  In the ED, radiographs demonstrated a distal radius fracture with displacement.  It was reduced and placed in a splint.  She has been taking some oxycodone for pain, but this causes a lot of itching.  As a result, she continues to have a lot of pain in the wrist.  She is able to move her fingers.  She denies numbness and tingling.   Review of Systems: No fevers or chills No numbness or tingling No chest pain No shortness of breath No bowel or bladder dysfunction No GI distress No headaches   Medical History:  Past Medical History:  Diagnosis Date   Anxiety    Diabetes mellitus without complication (HCC)    pre-diabetes   Gallstones    Herpes simplex virus (HSV) infection    06/2022   Hyperlipidemia    Migraine    Renal disorder    kidney stones    Past Surgical History:  Procedure Laterality Date   APPENDECTOMY     BREAST BIOPSY Right 2020   ADENOSIS AND FIBROCYSTIC CHANGES INCLUDING APOCRINE METAPLASIA WITH CALCIFICATIONS   LITHOTRIPSY      Family History  Problem Relation Age of Onset   Heart attack Maternal Grandmother    Heart attack Father    Alcohol abuse Father    Other Mother        septic shock  COPD Brother    Other Sister        MVA   COPD Brother    Social History   Tobacco Use   Smoking status: Never   Smokeless tobacco: Never  Vaping Use   Vaping status: Never Used  Substance Use Topics   Alcohol use: Not Currently   Drug use: No    Allergies  Allergen Reactions   Hydrocodone Itching    Current Meds  Medication Sig   diphenhydrAMINE (BENADRYL ALLERGY) 25 mg capsule Take 1 capsule (25 mg total) by mouth every 6 (six) hours as needed.   HYDROmorphone (DILAUDID) 2 MG tablet Take 0.5 tablets (1 mg total) by mouth every 6 (six) hours as needed for severe pain (pain score 7-10).   ALPRAZolam (XANAX) 1 MG tablet Take 1 mg by mouth at bedtime as needed for  anxiety.   atenolol (TENORMIN) 50 MG tablet Take 50 mg by mouth at bedtime.   clobetasol ointment (TEMOVATE) 0.05 % Take bid to affected area for 2 weeks then 2-3 x wekly   metFORMIN (GLUCOPHAGE) 500 MG tablet TAKE ONE TABLET BY MOUTH ONCE DAILY   Multiple Vitamin (MULTIVITAMIN) tablet Take 1 tablet by mouth daily.   nystatin ointment (MYCOSTATIN) Apply 1 Application topically 2 (two) times daily.   ondansetron (ZOFRAN) 4 MG tablet Take 1 tablet (4 mg total) by mouth every 6 (six) hours.   rosuvastatin (CRESTOR) 10 MG tablet Take 10 mg by mouth daily.    Objective: Ht 5' (1.524 m)   Wt 127 lb (57.6 kg)   BMI 24.80 kg/m   Physical Exam:  General: Alert and oriented. and No acute distress. Gait: Normal gait.  Right arm in a splint which is clean, dry and intact.  Exposed fingers are swollen.  There is some bruising.  She is able to wiggle her fingers.  No skin breakdown at the periphery of the splint.  Sensation is intact throughout her exposed fingers.  IMAGING: I personally reviewed images previously obtained from the ED  X-rays of the right wrist demonstrate a displaced distal radius fracture, with dorsal angulation and dorsal comminution.  CT scan confirms intra-articular involvement.   CT scan right wrist  IMPRESSION: 1. Acute comminuted fracture of the distal radius and ulnar styloid, as described. 2. Traversing fourth extensor compartment tendons closely approximate the distal radial fracture site and could be at risk for entrapment. 3. Ulnar styloid fracture fragment abuts the traversing extensor carpi ulnaris tendon.     New Medications:  Meds ordered this encounter  Medications   HYDROmorphone (DILAUDID) 2 MG tablet    Sig: Take 0.5 tablets (1 mg total) by mouth every 6 (six) hours as needed for severe pain (pain score 7-10).    Dispense:  20 tablet    Refill:  0   diphenhydrAMINE (BENADRYL ALLERGY) 25 mg capsule    Sig: Take 1 capsule (25 mg total) by mouth  every 6 (six) hours as needed.    Dispense:  30 capsule    Refill:  0      Oliver Barre, MD  11/04/2023 10:28 AM

## 2023-11-11 ENCOUNTER — Encounter: Payer: Self-pay | Admitting: Orthopedic Surgery

## 2023-11-11 ENCOUNTER — Ambulatory Visit (INDEPENDENT_AMBULATORY_CARE_PROVIDER_SITE_OTHER): Payer: Medicare Other | Admitting: Orthopedic Surgery

## 2023-11-11 ENCOUNTER — Other Ambulatory Visit (INDEPENDENT_AMBULATORY_CARE_PROVIDER_SITE_OTHER): Payer: Self-pay

## 2023-11-11 DIAGNOSIS — S52611D Displaced fracture of right ulna styloid process, subsequent encounter for closed fracture with routine healing: Secondary | ICD-10-CM | POA: Diagnosis not present

## 2023-11-11 DIAGNOSIS — S52531A Colles' fracture of right radius, initial encounter for closed fracture: Secondary | ICD-10-CM

## 2023-11-11 DIAGNOSIS — S52531D Colles' fracture of right radius, subsequent encounter for closed fracture with routine healing: Secondary | ICD-10-CM | POA: Diagnosis not present

## 2023-11-11 MED ORDER — HYDROMORPHONE HCL 2 MG PO TABS: 1.0000 mg | ORAL_TABLET | Freq: Four times a day (QID) | ORAL | 0 refills | Status: DC | PRN

## 2023-11-11 NOTE — Patient Instructions (Signed)
 Elevate the hand to help with swelling  Medications as needed  Keep the splint clean, dry and intact

## 2023-11-11 NOTE — Progress Notes (Signed)
 Return patient Visit  Assessment: Margaret Cantu is a 68 y.o. female with the following: 1. Closed Colles' fracture of right radius, subsequent encounter 2.  Displaced ulnar styloid fracture  Plan: Margaret Cantu fell and landed on an outstretched right arm, a little over a week ago.  She continues to use a sling, and has done well with her splint.  Radiographs today demonstrates some mild subsidence, especially through the dorsal comminution.  Overall, alignment is acceptable.  She would like to continue with nonoperative management.  I think this is reasonable.  I would like see her back in 1 week for repeat radiographs.  Follow-up: Return in about 1 week (around 11/18/2023).  Subjective:  Chief Complaint  Patient presents with   Follow-up    Recheck on right wrist fracture, DOI 11-01-23.    History of Present Illness: Margaret Cantu is a 68 y.o. female who returns for evaluation of right wrist pain.  She sustained a distal radius fracture, which required reduction in the emergency department a little over a week ago.  She elected to proceed with nonoperative management.  She has done well with the splint.  She continues take pain medications.  He denies numbness and tingling.  Review of Systems: No fevers or chills No numbness or tingling No chest pain No shortness of breath No bowel or bladder dysfunction No GI distress No headaches   Objective: There were no vitals taken for this visit.  Physical Exam:  General: Alert and oriented. and No acute distress. Gait: Normal gait.  Right arm in a splint which is clean, dry and intact.  Exposed fingers are swollen.  There is some bruising.  She is able to wiggle her fingers.  No skin breakdown at the periphery of the splint.  Sensation is intact throughout her exposed fingers.  IMAGING: I personally ordered and reviewed the following images  X-rays of the right wrist were obtained in clinic today, within her  splint.  These are compared to prior x-rays.  There is a comminuted distal radius fracture, with mild dorsal angulation of the joint surface.  Compared to prior x-rays, there has been some interval subsidence, with limited displacement overall.  Ulnar styloid remains in an unchanged position.  Impression: Comminuted right distal radius fracture, with interval subsidence; stable ulnar styloid fracture   New Medications:  Meds ordered this encounter  Medications   HYDROmorphone (DILAUDID) 2 MG tablet    Sig: Take 0.5 tablets (1 mg total) by mouth every 6 (six) hours as needed for severe pain (pain score 7-10).    Dispense:  20 tablet    Refill:  0      Oliver Barre, MD  11/11/2023 10:09 AM

## 2023-11-18 ENCOUNTER — Other Ambulatory Visit (INDEPENDENT_AMBULATORY_CARE_PROVIDER_SITE_OTHER): Payer: Self-pay

## 2023-11-18 ENCOUNTER — Ambulatory Visit (INDEPENDENT_AMBULATORY_CARE_PROVIDER_SITE_OTHER): Admitting: Orthopedic Surgery

## 2023-11-18 ENCOUNTER — Encounter: Payer: Self-pay | Admitting: Orthopedic Surgery

## 2023-11-18 DIAGNOSIS — S52531D Colles' fracture of right radius, subsequent encounter for closed fracture with routine healing: Secondary | ICD-10-CM

## 2023-11-18 DIAGNOSIS — S52611D Displaced fracture of right ulna styloid process, subsequent encounter for closed fracture with routine healing: Secondary | ICD-10-CM | POA: Diagnosis not present

## 2023-11-18 NOTE — Progress Notes (Signed)
 Return patient Visit  Assessment: Margaret Cantu is a 68 y.o. female with the following: 1. Closed Colles' fracture of right radius, subsequent encounter 2.  Displaced ulnar styloid fracture  Plan: Margaret Cantu fell and landed on an outstretched right arm approximately 2 weeks ago.  Repeat radiographs out of her splint were obtained today, and demonstrate stable overall alignment.  There is mild dorsal tilt of the joint line.  She remains interested in avoiding surgery.  Once again, we discussed the possibility of residual deformity, with slight loss of motion and strength.  However, I do think she will end up with good function overall.  She is in agreement.  She would like to proceed without surgery.  She was placed in a cast today.  Follow-up in 2 weeks.  Cast application - Right short arm cast   Verbal consent was obtained and the correct extremity was identified. A well padded, appropriately molded short arm cast was applied to the Right arm Fingers remained warm and well perfused.   There were no sharp edges Patient tolerated the procedure well Cast care instructions were provided    Follow-up: Return in about 2 weeks (around 12/02/2023).  Subjective:  Chief Complaint  Patient presents with   Follow-up    Recheck on right wrist fracture    History of Present Illness: Margaret Cantu is a 68 y.o. female who returns for evaluation of right wrist pain.  She sustained a distal radius fracture, which required reduction in the emergency department approximately 2 weeks ago.  She has been in a splint.  Her pain has remained stable.  She has not been taking pain medications, as she has had issues with her insurance paying for a refill.  She is able to wiggle her fingers.  She denies numbness and tingling.    Review of Systems: No fevers or chills No numbness or tingling No chest pain No shortness of breath No bowel or bladder dysfunction No GI distress No  headaches   Objective: There were no vitals taken for this visit.  Physical Exam:  General: Alert and oriented. and No acute distress. Gait: Normal gait.  After removal of the splint, there is no skin breakdown.  Healing bruising, and improving swelling.  She is able to flex her fingers, but unable to make a full fist.  She is just short of full extension of all fingers.  Sensation is intact throughout the right hand.  IMAGING: I personally ordered and reviewed the following images  X-rays of the right wrist were obtained in clinic today.  These were taken out of her splint.  These are compared to prior x-rays.  Comminuted distal radius fracture, with mild subsidence.  There is mild dorsal tilt, as evidenced on the lateral projection.  Comminution of the dorsal cortex remained stable.  Minimally displaced ulnar styloid fracture.  No additional injuries noted.  Impression: Stable right distal radius fracture and ulnar styloid fracture   New Medications:  No orders of the defined types were placed in this encounter.     Oliver Barre, MD  11/18/2023 10:10 AM

## 2023-11-18 NOTE — Patient Instructions (Signed)

## 2023-12-02 ENCOUNTER — Other Ambulatory Visit (INDEPENDENT_AMBULATORY_CARE_PROVIDER_SITE_OTHER): Payer: Self-pay

## 2023-12-02 ENCOUNTER — Encounter: Payer: Self-pay | Admitting: Orthopedic Surgery

## 2023-12-02 ENCOUNTER — Ambulatory Visit: Admitting: Orthopedic Surgery

## 2023-12-02 DIAGNOSIS — S52531D Colles' fracture of right radius, subsequent encounter for closed fracture with routine healing: Secondary | ICD-10-CM

## 2023-12-02 DIAGNOSIS — S52611D Displaced fracture of right ulna styloid process, subsequent encounter for closed fracture with routine healing: Secondary | ICD-10-CM

## 2023-12-02 NOTE — Progress Notes (Signed)
 Return patient Visit  Assessment: Margaret Cantu is a 68 y.o. female with the following: 1. Closed Colles' fracture of right radius, subsequent encounter 2.  Displaced ulnar styloid fracture  Plan: Thelda L Swaggerty fell and landed on an outstretched right arm approximately 4 weeks ago.  Radiographs remain unchanged compared to the last visit.  Will continue with another cast.  Anticipate transition to a brace at the next visit.  This was discussed with the patient.  She will continue to work on range of motion of her fingers.  Elevate the hand to help with swelling.  Medications as needed.  Cast application - Right short arm cast   Verbal consent was obtained and the correct extremity was identified. A well padded, appropriately molded short arm cast was applied to the Right arm Fingers remained warm and well perfused.   There were no sharp edges Patient tolerated the procedure well Cast care instructions were provided    Follow-up: Return in about 2 weeks (around 12/16/2023).  Subjective:  Chief Complaint  Patient presents with   Fracture    R wrist DOI 11/01/23    History of Present Illness: Margaret Cantu is a 68 y.o. female who returns for evaluation of right wrist pain.  She sustained a distal radius fracture, which required reduction in the emergency department approximately 4 weeks ago.  She has been immobilized for 4 weeks.  She tolerated the most recent cast well.  She is not taking Tylenol for pain.  No numbness or tingling.   Review of Systems: No fevers or chills No numbness or tingling No chest pain No shortness of breath No bowel or bladder dysfunction No GI distress No headaches   Objective: There were no vitals taken for this visit.  Physical Exam:  General: Alert and oriented. and No acute distress. Gait: Normal gait.  After removal of the cast, there is no skin breakdown.  There is mild bruising and swelling around the wrist.  Fingers  warm and well-perfused.  Sensation is intact throughout the right hand.  She is lacking some extension of her fingers, but is doing quite well with her range of motion.  IMAGING: I personally ordered and reviewed the following images  X-rays of the right wrist were obtained in clinic today.  These are compared to prior x-rays.  Distal radius fracture, with dorsal comminution.  Remains in stable alignment.  No interval displacement.  Joint line with mild dorsal tilt.  Small ulnar styloid fracture with minimal displacement.  Impression: Stable right distal radius and ulnar styloid fractures   New Medications:  No orders of the defined types were placed in this encounter.     Oliver Barre, MD  12/02/2023 10:11 AM

## 2023-12-16 ENCOUNTER — Encounter: Payer: Self-pay | Admitting: Orthopedic Surgery

## 2023-12-16 ENCOUNTER — Other Ambulatory Visit (INDEPENDENT_AMBULATORY_CARE_PROVIDER_SITE_OTHER): Payer: Self-pay

## 2023-12-16 ENCOUNTER — Ambulatory Visit: Admitting: Orthopedic Surgery

## 2023-12-16 DIAGNOSIS — S52611D Displaced fracture of right ulna styloid process, subsequent encounter for closed fracture with routine healing: Secondary | ICD-10-CM | POA: Diagnosis not present

## 2023-12-16 DIAGNOSIS — S52531D Colles' fracture of right radius, subsequent encounter for closed fracture with routine healing: Secondary | ICD-10-CM | POA: Diagnosis not present

## 2023-12-16 NOTE — Progress Notes (Signed)
 Return patient Visit  Assessment: KATRENIA ALKINS is a 68 y.o. female with the following: 1. Closed Colles' fracture of right radius, subsequent encounter 2.  Displaced ulnar styloid fracture  Plan: Mykenna L Sebesta fell and landed on an outstretched right arm approximately 6 weeks ago.  She has been immobilized since the injury.  Radiographs are stable.  She has some dorsal angulation of the joint line, which may result in a mild residual deformity.  Anticipate that she will regain most of her function.  At this point, we will transition her to a brace.  The next 2 weeks, she should wear the brace at all times.  Okay to remove for hygiene and exercises.  Simple exercises have been provided.  Focus on range of motion and then transition to strengthening.  In 2 weeks, can start to transition out of the brace.  I would like to see her back in approximately 1 month.   Follow-up: Return in about 4 weeks (around 01/13/2024).  Subjective:  Chief Complaint  Patient presents with   Fracture    R wrist DOI 11/01/23    History of Present Illness: ALEIYAH HALPIN is a 68 y.o. female who returns for evaluation of right wrist pain.  She sustained injury to her right wrist approximately 6 weeks ago.  She has been immobilized.  Her pain is improving.  She continues to have stiffness in her fingers.  She also notes swelling.  Review of Systems: No fevers or chills No numbness or tingling No chest pain No shortness of breath No bowel or bladder dysfunction No GI distress No headaches   Objective: There were no vitals taken for this visit.  Physical Exam:  General: Alert and oriented. and No acute distress. Gait: Normal gait.  No skin breakdown after removal of the cast.  She does have some dry skin.  Mild deformity of the wrist.  Mild swelling.  Residual stiffness of her fingers.  Also has some residual swelling.  Fingers are warm and well-perfused  IMAGING: I personally ordered  and reviewed the following images  X-rays of the right wrist were obtained in clinic today.  These were compared to prior x-rays.  Distal radius fracture with dorsal comminution.  Dorsal tilt of the joint line, as indicated on the lateral projection.  Compared to prior x-rays, there has been no change in overall alignment.  Minimally displaced ulnar styloid fracture.  Impression: Stable right distal radius and ulnar styloid fractures   New Medications:  No orders of the defined types were placed in this encounter.     Oliver Barre, MD  12/16/2023 11:37 AM

## 2023-12-16 NOTE — Patient Instructions (Addendum)
 Brace no times for the next 2 weeks.  Okay to remove for hygiene, and work on range of motion exercises.  Fingers and wrist.  In 2 weeks, you can start to transition out of the brace.   Hand Exercises  Hand exercises can be helpful for almost anyone. These exercises can strengthen the hands, improve flexibility and movement, and increase blood flow to the hands. These results can make work and daily tasks easier. Hand exercises can be especially helpful for people who have joint pain from arthritis or have nerve damage from overuse (carpal tunnel syndrome). These exercises can also help people who have injured a hand.  Exercises Most of these hand exercises are gentle stretching and motion exercises. It is usually safe to do them often throughout the day. Warming up your hands before exercise may help to reduce stiffness. You can do this with gentle massage or by placing your hands in warm water for 10-15 minutes. It is normal to feel some stretching, pulling, tightness, or mild discomfort as you begin new exercises. This will gradually improve. Stop an exercise right away if you feel sudden, severe pain or your pain gets worse. Ask your health care provider which exercises are best for you. Knuckle bend or "claw" fist Stand or sit with your arm, hand, and all five fingers pointed straight up. Make sure to keep your wrist straight during the exercise. Gently bend your fingers down toward your palm until the tips of your fingers are touching the top of your palm. Keep your big knuckle straight and just bend the small knuckles in your fingers. Hold this position for 10 seconds. Straighten (extend) your fingers back to the starting position. Repeat this exercise 5-10 times with each hand. Full finger fist Stand or sit with your arm, hand, and all five fingers pointed straight up. Make sure to keep your wrist straight during the exercise. Gently bend your fingers into your palm until the tips of  your fingers are touching the middle of your palm. Hold this position for 10 seconds. Extend your fingers back to the starting position, stretching every joint fully. Repeat this exercise 5-10 times with each hand. Straight fist Stand or sit with your arm, hand, and all five fingers pointed straight up. Make sure to keep your wrist straight during the exercise. Gently bend your fingers at the big knuckle, where your fingers meet your hand, and the middle knuckle. Keep the knuckle at the tips of your fingers straight and try to touch the bottom of your palm. Hold this position for 10 seconds. Extend your fingers back to the starting position, stretching every joint fully. Repeat this exercise 5-10 times with each hand. Tabletop Stand or sit with your arm, hand, and all five fingers pointed straight up. Make sure to keep your wrist straight during the exercise. Gently bend your fingers at the big knuckle, where your fingers meet your hand, as far down as you can while keeping the small knuckles in your fingers straight. Think of forming a tabletop with your fingers. Hold this position for 10 seconds. Extend your fingers back to the starting position, stretching every joint fully. Repeat this exercise 5-10 times with each hand. Finger spread Place your hand flat on a table with your palm facing down. Make sure your wrist stays straight as you do this exercise. Spread your fingers and thumb apart from each other as far as you can until you feel a gentle stretch. Hold this position for 10  seconds. Bring your fingers and thumb tight together again. Hold this position for 10 seconds. Repeat this exercise 5-10 times with each hand. Making circles Stand or sit with your arm, hand, and all five fingers pointed straight up. Make sure to keep your wrist straight during the exercise. Make a circle by touching the tip of your thumb to the tip of your index finger. Hold for 10 seconds. Then open your hand  wide. Repeat this motion with your thumb and each finger on your hand. Repeat this exercise 5-10 times with each hand. Thumb motion Sit with your forearm resting on a table and your wrist straight. Your thumb should be facing up toward the ceiling. Keep your fingers relaxed as you move your thumb. Lift your thumb up as high as you can toward the ceiling. Hold for 10 seconds. Bend your thumb across your palm as far as you can, reaching the tip of your thumb for the small finger (pinkie) side of your palm. Hold for 10 seconds. Repeat this exercise 5-10 times with each hand. Grip strengthening Hold a stress ball or other soft ball in the middle of your hand. Slowly increase the pressure, squeezing the ball as much as you can without causing pain. Think of bringing the tips of your fingers into the middle of your palm. All of your finger joints should bend when doing this exercise. Hold your squeeze for 10 seconds, then relax. Repeat this exercise 5-10 times with each hand.   Contact a health care provider if: Your hand pain or discomfort gets much worse when you do an exercise. Your hand pain or discomfort does not improve within 2 hours after you exercise. If you have any of these problems, stop doing these exercises right away. Do not do them again unless your health care provider says that you can.    Get help right away if: You develop sudden, severe hand pain or swelling. If this happens, stop doing these exercises right away. Do not do them again unless your health care provider says that you can. This information is not intended to replace advice given to you by your health care provider. Make sure you discuss any questions you have with your health care provider.   Wrist Fracture Rehab Ask your health care provider which exercises are safe for you. Do exercises exactly as told by your health care provider and adjust them as directed. It is normal to feel mild stretching, pulling,  tightness, or discomfort as you do these exercises. Stop right away if you feel sudden pain or your pain gets worse. Do not begin these exercises until told by your health care provider. Stretching and range-of-motion exercises These exercises warm up your muscles and joints and improve the movement and flexibility of your wrist and hand. These exercises also help to relieve pain,numbness, and tingling. Finger flexion and extension Sit or stand with your elbow at your side. Open and stretch your left / right fingers as wide as you can (extension). Hold this position for 10 seconds. Close your left / right fingers into a gentle fist (flexion). Hold this position for 10 seconds. Slowly return to the starting position. Repeat 10 times. Complete this exercise 1-2 times a day. Wrist flexion Bend your left / right elbow to a 90-degree angle (right angle) with your palm facing the floor. Bend your wrist forward so your fingers point toward the floor (flexion). Hold this position for 10 seconds. Slowly return to the starting position. Repeat  10 times. Complete this exercise 1-2 times a day. Wrist extension Bend your left / right elbow to a 90-degree angle (right angle) with your palm facing the floor. Bend your wrist backward so your fingers point toward the ceiling (extension). Hold this position for 10 seconds. Slowly return to the starting position. Repeat 10 times. Complete this exercise 1-2 times a day. Ulnar deviation Bend your left / right elbow to a 90-degree angle (right angle), and rest your forearm on a table with your palm facing down. Keeping your hand flat on the table, bend your left / right wrist toward your small finger (pinkie). This is ulnar deviation. Hold this position for 10 seconds. Slowly return to the starting position. Repeat 10 times. Complete this exercise 1-2 times a day. Radial deviation Bend your left / right elbow to a 90-degree angle (right angle), and rest your  forearm on a table with your palm facing down. Keeping your hand flat on the table, bend your left / right wrist toward your thumb. This is radial deviation. Hold this position for 10 seconds. Slowly return to the starting position. Repeat 10 times. Complete this exercise 1-2 times a day. Forearm rotation, supination Stand or sit with your left / right elbow bent to a 90-degree angle (right angle) at your side. Position your forearm so that the thumb is facing the ceiling (neutral position). Turn (rotate) your palm up toward the ceiling (supination), stopping when you feel a gentle stretch. Hold this position for 10 seconds. Slowly return to the starting position. Repeat 10 times. Complete this exercise 1-2 times a day. Forearm rotation, pronation Stand or sit with your left / right elbow bent to a 90-degree angle (right angle) at your side. Position your forearm so that the thumb is facing the ceiling (neutral position). Turn (rotate) your palm down toward the floor (pronation), stopping when you feel a gentle stretch. Hold this position for 10 seconds. Slowly return to the starting position. Repeat 10 times. Complete this exercise 1-2 times a day. Wrist flexion stretch  Extend your left / right arm in front of you and turn your palm down toward the floor. If told by your health care provider, bend your left / right arm to a 90-degree angle (right angle) at your side. Using your uninjured hand, gently press over the back of your left / right hand to bend your wrist and fingers toward the floor (flexion). Go as far as you can to feel a stretch without causing pain. Hold this position for 10 seconds. Slowly return to the starting position. Repeat 10 times. Complete this exercise 1-2 times a day. Wrist extension stretch  Extend your left / right arm in front of you and turn your palm up toward the ceiling. If told by your health care provider, bend your left / right arm to a 90-degree angle  (right angle) at your side. Using your uninjured hand, gently press over the palm of your left / right hand to bend your wrist and fingers toward the floor (extension). Go as far as you can to feel a stretch without causing pain. Hold this position for 10 seconds. Slowly return to the starting position. Repeat 10 times. Complete this exercise 1-2 times a day. Forearm rotation stretch, supination Stand or sit with your arms at your sides. Bend your left / right elbow to a 90-degree angle (right angle). Using your uninjured hand, turn your left / right palm up toward the ceiling (assisted supination) until you  feel a gentle stretch in the inside of your forearm. Hold this position for 10 seconds. Slowly return to the starting position. Repeat 10 times. Complete this exercise 1-2 times a day. Forearm rotation stretch, pronation Stand or sit with your arms at your sides. Bend your left / right elbow to a 90-degree angle (right angle). Using your uninjured hand, turn your left / right palm down toward the floor (assisted pronation) until you feel a gentle stretch in the top of your forearm. Hold this position for 10 seconds. Slowly return to the starting position. Repeat 10 times. Complete this exercise 1-2 times a day. Strengthening exercises These exercises build strength and endurance in your wrist and hand. Enduranceis the ability to use your muscles for a long time, even after they get tired. Wrist flexion Sit with your left / right forearm supported on a table. Your elbow should be at waist height. Rest your hand over the edge of the table, palm up. Gently grasp a 5 lb / kg weight (can of soup). Or, hold an exercise band or tube in both hands, keeping your hands at the same level and hip distance apart. There should be slight tension in the exercise band or tube. Without moving your forearm or elbow, slowly bend your wrist up toward the ceiling (wrist flexion). Hold this position for 10  seconds. Slowly return to the starting position. Repeat 10 times. Complete this exercise 1-2 times a day. Wrist extension Sit with your left / right forearm supported on a table. Your elbow should be at waist height. Rest your hand over the edge of the table, palm down. Gently grasp a 5 lb / kg weight. Or, hold an exercise band or tube in both hands, keeping your hands at the same level and hip distance apart. There should be slight tension in the exercise band or tube. Without moving your forearm or elbow, slowly curl your hand up toward the ceiling (extension). Hold this position for 10 seconds. Slowly return to the starting position. Repeat 10 times. Complete this exercise 1-2 times a day. Forearm rotation, supination  Sit with your left / right forearm supported on a table. Your elbow should be at waist height. Rest your hand over the edge of the table, palm down. Gently grasp a lightweight hammer near the head. As this exercise gets easier for you, try holding the hammer farther down the handle. Without moving your elbow, slowly turn (rotate) your palm up toward the ceiling (supination). Hold this position for 10 seconds. Slowly return to the starting position. Repeat 10 times. Complete this exercise 1-2 times a day. Forearm rotation, pronation  Sit with your left / right forearm supported on a table. Your elbow should be at waist height. Rest your hand over the edge of the table, palm up. Gently grasp a lightweight hammer near the head. As this exercise gets easier for you, try holding the hammer farther down the handle. Without moving your elbow, slowly turn (rotate) your palm down toward the floor (pronation). Hold this position for 10 seconds. Slowly return to the starting position. Repeat 10 times. Complete this exercise 1-2 times a day. Grip strengthening  Hold one of these items in your left / right hand: a dense sponge, a stress ball, or a large, rolled sock. Slowly  squeeze the object as hard as you can without increasing any pain. Hold your squeeze for 10 seconds. Slowly release your grip. Repeat 10 times. Complete this exercise 1-2 times a day.  This information is not intended to replace advice given to you by your health care provider. Make sure you discuss any questions you have with your healthcare provider. Document Revised: 01/06/2020 Document Reviewed: 01/06/2020 Elsevier Patient Education  2022 [employer].

## 2024-01-13 ENCOUNTER — Ambulatory Visit: Admitting: Orthopedic Surgery

## 2024-01-13 ENCOUNTER — Encounter: Payer: Self-pay | Admitting: Orthopedic Surgery

## 2024-01-13 ENCOUNTER — Other Ambulatory Visit (INDEPENDENT_AMBULATORY_CARE_PROVIDER_SITE_OTHER): Payer: Self-pay

## 2024-01-13 DIAGNOSIS — S52611D Displaced fracture of right ulna styloid process, subsequent encounter for closed fracture with routine healing: Secondary | ICD-10-CM

## 2024-01-13 DIAGNOSIS — S52531D Colles' fracture of right radius, subsequent encounter for closed fracture with routine healing: Secondary | ICD-10-CM

## 2024-01-13 DIAGNOSIS — B351 Tinea unguium: Secondary | ICD-10-CM | POA: Insufficient documentation

## 2024-01-13 DIAGNOSIS — M25531 Pain in right wrist: Secondary | ICD-10-CM | POA: Diagnosis not present

## 2024-01-13 MED ORDER — IBUPROFEN 600 MG PO TABS: 600.0000 mg | ORAL_TABLET | Freq: Three times a day (TID) | ORAL | 0 refills | Status: AC | PRN

## 2024-01-13 NOTE — Progress Notes (Signed)
 Return patient Visit  Assessment: Margaret Cantu is a 68 y.o. female with the following: 1. Closed Colles' fracture of right radius, subsequent encounter 2.  Displaced ulnar styloid fracture  Plan: Charlsey L Souders fell and landed on an outstretched right arm a little over 2 months ago.  Radiographs are stable.  She has healed the fracture, with mild deformity.  Anticipate continued improvements.  At this point, it is okay for her to start to use the wrist more.  Urged her to get rid of the brace.  She can work on exercises at home, as well as start to use her right hand more for activities at home.  This was discussed in detail.  She is a diabetic, so would avoid steroids, but I have recommended ibuprofen  on a regular basis for the next 7 days.  If she continues to struggle over the next couple weeks, with regards to her motion, would recommend working with a therapist.  Follow-up in a month.   Follow-up: Return in about 4 weeks (around 02/10/2024).  Subjective:  Chief Complaint  Patient presents with   Fracture    R wrist DOI 11/01/23    History of Present Illness: Margaret Cantu is a 68 y.o. female who returns for evaluation of right wrist pain.  She sustained injury to her right wrist approximately 2-3 months ago.  She has been using a brace, and working on some range of motion.  She continues to wear the brace when she is out of the house.  She also wears the brace to bed.  Occasional shooting pains.  She is not taking any Tylenol  ibuprofen  on a regular basis.  She states that she does not know how much to push her right wrist.   Review of Systems: No fevers or chills No numbness or tingling No chest pain No shortness of breath No bowel or bladder dysfunction No GI distress No headaches   Objective: There were no vitals taken for this visit.  Physical Exam:  General: Alert and oriented. and No acute distress. Gait: Normal gait.  Evaluation of the right wrist  demonstrates mild deformity.  She has diffuse swelling and discoloration about the wrist.  Fingers are warm and well-perfused.  Almost able to make a full fist.  She is able to extend her fingers.  She has limited flexion, but has pretty good extension on physical exam.  IMAGING: I personally ordered and reviewed the following images  The right wrist were obtained in clinic today.  These are compared to available x-rays.  There is a stable appearing distal radius fracture, with dorsal comminution.  Dorsal tilt of the joint line.  Minimally displaced ulnar styloid fracture, without interval displacement.  There is some callus formation.  Impression: Healed right distal radius and ulnar styloid fractures   New Medications:  Meds ordered this encounter  Medications   ibuprofen  (ADVIL ) 600 MG tablet    Sig: Take 1 tablet (600 mg total) by mouth every 8 (eight) hours as needed.    Dispense:  60 tablet    Refill:  0      Tonita Frater, MD  01/13/2024 10:53 AM

## 2024-01-13 NOTE — Patient Instructions (Signed)
Wrist Fracture Rehab Ask your health care provider which exercises are safe for you. Do exercises exactly as told by your health care provider and adjust them as directed. It is normal to feel mild stretching, pulling, tightness, or discomfort as you do these exercises. Stop right away if you feel sudden pain or your pain gets worse. Do not begin these exercises until told by your health care provider. Stretching and range-of-motion exercises These exercises warm up your muscles and joints and improve the movement and flexibility of your wrist and hand. These exercises also help to relieve pain,numbness, and tingling. Finger flexion and extension Sit or stand with your elbow at your side. Open and stretch your left / right fingers as wide as you can (extension). Hold this position for 10 seconds. Close your left / right fingers into a gentle fist (flexion). Hold this position for 10 seconds. Slowly return to the starting position. Repeat 10 times. Complete this exercise 1-2 times a day. Wrist flexion Bend your left / right elbow to a 90-degree angle (right angle) with your palm facing the floor. Bend your wrist forward so your fingers point toward the floor (flexion). Hold this position for 10 seconds. Slowly return to the starting position. Repeat 10 times. Complete this exercise 1-2 times a day. Wrist extension Bend your left / right elbow to a 90-degree angle (right angle) with your palm facing the floor. Bend your wrist backward so your fingers point toward the ceiling (extension). Hold this position for 10 seconds. Slowly return to the starting position. Repeat 10 times. Complete this exercise 1-2 times a day. Ulnar deviation Bend your left / right elbow to a 90-degree angle (right angle), and rest your forearm on a table with your palm facing down. Keeping your hand flat on the table, bend your left / right wrist toward your small finger (pinkie). This is ulnar deviation. Hold this  position for 10 seconds. Slowly return to the starting position. Repeat 10 times. Complete this exercise 1-2 times a day. Radial deviation Bend your left / right elbow to a 90-degree angle (right angle), and rest your forearm on a table with your palm facing down. Keeping your hand flat on the table, bend your left / right wrist toward your thumb. This is radial deviation. Hold this position for 10 seconds. Slowly return to the starting position. Repeat 10 times. Complete this exercise 1-2 times a day. Forearm rotation, supination Stand or sit with your left / right elbow bent to a 90-degree angle (right angle) at your side. Position your forearm so that the thumb is facing the ceiling (neutral position). Turn (rotate) your palm up toward the ceiling (supination), stopping when you feel a gentle stretch. Hold this position for 10 seconds. Slowly return to the starting position. Repeat 10 times. Complete this exercise 1-2 times a day. Forearm rotation, pronation Stand or sit with your left / right elbow bent to a 90-degree angle (right angle) at your side. Position your forearm so that the thumb is facing the ceiling (neutral position). Turn (rotate) your palm down toward the floor (pronation), stopping when you feel a gentle stretch. Hold this position for 10 seconds. Slowly return to the starting position. Repeat 10 times. Complete this exercise 1-2 times a day. Wrist flexion stretch  Extend your left / right arm in front of you and turn your palm down toward the floor. If told by your health care provider, bend your left / right arm to a 90-degree angle (  right angle) at your side. Using your uninjured hand, gently press over the back of your left / right hand to bend your wrist and fingers toward the floor (flexion). Go as far as you can to feel a stretch without causing pain. Hold this position for 10 seconds. Slowly return to the starting position. Repeat 10 times. Complete this  exercise 1-2 times a day. Wrist extension stretch  Extend your left / right arm in front of you and turn your palm up toward the ceiling. If told by your health care provider, bend your left / right arm to a 90-degree angle (right angle) at your side. Using your uninjured hand, gently press over the palm of your left / right hand to bend your wrist and fingers toward the floor (extension). Go as far as you can to feel a stretch without causing pain. Hold this position for 10 seconds. Slowly return to the starting position. Repeat 10 times. Complete this exercise 1-2 times a day. Forearm rotation stretch, supination Stand or sit with your arms at your sides. Bend your left / right elbow to a 90-degree angle (right angle). Using your uninjured hand, turn your left / right palm up toward the ceiling (assisted supination) until you feel a gentle stretch in the inside of your forearm. Hold this position for 10 seconds. Slowly return to the starting position. Repeat 10 times. Complete this exercise 1-2 times a day. Forearm rotation stretch, pronation Stand or sit with your arms at your sides. Bend your left / right elbow to a 90-degree angle (right angle). Using your uninjured hand, turn your left / right palm down toward the floor (assisted pronation) until you feel a gentle stretch in the top of your forearm. Hold this position for 10 seconds. Slowly return to the starting position. Repeat 10 times. Complete this exercise 1-2 times a day. Strengthening exercises These exercises build strength and endurance in your wrist and hand. Enduranceis the ability to use your muscles for a long time, even after they get tired. Wrist flexion Sit with your left / right forearm supported on a table. Your elbow should be at waist height. Rest your hand over the edge of the table, palm up. Gently grasp a 5 lb / kg weight (can of soup). Or, hold an exercise band or tube in both hands, keeping your hands at  the same level and hip distance apart. There should be slight tension in the exercise band or tube. Without moving your forearm or elbow, slowly bend your wrist up toward the ceiling (wrist flexion). Hold this position for 10 seconds. Slowly return to the starting position. Repeat 10 times. Complete this exercise 1-2 times a day. Wrist extension Sit with your left / right forearm supported on a table. Your elbow should be at waist height. Rest your hand over the edge of the table, palm down. Gently grasp a 5 lb / kg weight. Or, hold an exercise band or tube in both hands, keeping your hands at the same level and hip distance apart. There should be slight tension in the exercise band or tube. Without moving your forearm or elbow, slowly curl your hand up toward the ceiling (extension). Hold this position for 10 seconds. Slowly return to the starting position. Repeat 10 times. Complete this exercise 1-2 times a day. Forearm rotation, supination  Sit with your left / right forearm supported on a table. Your elbow should be at waist height. Rest your hand over the edge of the  table, palm down. Gently grasp a lightweight hammer near the head. As this exercise gets easier for you, try holding the hammer farther down the handle. Without moving your elbow, slowly turn (rotate) your palm up toward the ceiling (supination). Hold this position for 10 seconds. Slowly return to the starting position. Repeat 10 times. Complete this exercise 1-2 times a day. Forearm rotation, pronation  Sit with your left / right forearm supported on a table. Your elbow should be at waist height. Rest your hand over the edge of the table, palm up. Gently grasp a lightweight hammer near the head. As this exercise gets easier for you, try holding the hammer farther down the handle. Without moving your elbow, slowly turn (rotate) your palm down toward the floor (pronation). Hold this position for 10 seconds. Slowly return  to the starting position. Repeat 10 times. Complete this exercise 1-2 times a day. Grip strengthening  Hold one of these items in your left / right hand: a dense sponge, a stress ball, or a large, rolled sock. Slowly squeeze the object as hard as you can without increasing any pain. Hold your squeeze for 10 seconds. Slowly release your grip. Repeat 10 times. Complete this exercise 1-2 times a day. This information is not intended to replace advice given to you by your health care provider. Make sure you discuss any questions you have with your healthcare provider. Document Revised: 01/06/2020 Document Reviewed: 01/06/2020 [employer] Patient Education  2022 [employer]     Hand Exercises  Hand exercises can be helpful for almost anyone. These exercises can strengthen the hands, improve flexibility and movement, and increase blood flow to the hands. These results can make work and daily tasks easier. Hand exercises can be especially helpful for people who have joint pain from arthritis or have nerve damage from overuse (carpal tunnel syndrome). These exercises can also help people who have injured a hand.  Exercises Most of these hand exercises are gentle stretching and motion exercises. It is usually safe to do them often throughout the day. Warming up your hands before exercise may help to reduce stiffness. You can do this with gentle massage or by placing your hands in warm water for 10-15 minutes. It is normal to feel some stretching, pulling, tightness, or mild discomfort as you begin new exercises. This will gradually improve. Stop an exercise right away if you feel sudden, severe pain or your pain gets worse. Ask your health care provider which exercises are best for you. Knuckle bend or "claw" fist Stand or sit with your arm, hand, and all five fingers pointed straight up. Make sure to keep your wrist straight during the exercise. Gently bend your fingers down toward your palm until  the tips of your fingers are touching the top of your palm. Keep your big knuckle straight and just bend the small knuckles in your fingers. Hold this position for 10 seconds. Straighten (extend) your fingers back to the starting position. Repeat this exercise 5-10 times with each hand. Full finger fist Stand or sit with your arm, hand, and all five fingers pointed straight up. Make sure to keep your wrist straight during the exercise. Gently bend your fingers into your palm until the tips of your fingers are touching the middle of your palm. Hold this position for 10 seconds. Extend your fingers back to the starting position, stretching every joint fully. Repeat this exercise 5-10 times with each hand. Straight fist Stand or sit with your arm, hand,  and all five fingers pointed straight up. Make sure to keep your wrist straight during the exercise. Gently bend your fingers at the big knuckle, where your fingers meet your hand, and the middle knuckle. Keep the knuckle at the tips of your fingers straight and try to touch the bottom of your palm. Hold this position for 10 seconds. Extend your fingers back to the starting position, stretching every joint fully. Repeat this exercise 5-10 times with each hand. Tabletop Stand or sit with your arm, hand, and all five fingers pointed straight up. Make sure to keep your wrist straight during the exercise. Gently bend your fingers at the big knuckle, where your fingers meet your hand, as far down as you can while keeping the small knuckles in your fingers straight. Think of forming a tabletop with your fingers. Hold this position for 10 seconds. Extend your fingers back to the starting position, stretching every joint fully. Repeat this exercise 5-10 times with each hand. Finger spread Place your hand flat on a table with your palm facing down. Make sure your wrist stays straight as you do this exercise. Spread your fingers and thumb apart from each  other as far as you can until you feel a gentle stretch. Hold this position for 10 seconds. Bring your fingers and thumb tight together again. Hold this position for 10 seconds. Repeat this exercise 5-10 times with each hand. Making circles Stand or sit with your arm, hand, and all five fingers pointed straight up. Make sure to keep your wrist straight during the exercise. Make a circle by touching the tip of your thumb to the tip of your index finger. Hold for 10 seconds. Then open your hand wide. Repeat this motion with your thumb and each finger on your hand. Repeat this exercise 5-10 times with each hand. Thumb motion Sit with your forearm resting on a table and your wrist straight. Your thumb should be facing up toward the ceiling. Keep your fingers relaxed as you move your thumb. Lift your thumb up as high as you can toward the ceiling. Hold for 10 seconds. Bend your thumb across your palm as far as you can, reaching the tip of your thumb for the small finger (pinkie) side of your palm. Hold for 10 seconds. Repeat this exercise 5-10 times with each hand. Grip strengthening Hold a stress ball or other soft ball in the middle of your hand. Slowly increase the pressure, squeezing the ball as much as you can without causing pain. Think of bringing the tips of your fingers into the middle of your palm. All of your finger joints should bend when doing this exercise. Hold your squeeze for 10 seconds, then relax. Repeat this exercise 5-10 times with each hand.   Contact a health care provider if: Your hand pain or discomfort gets much worse when you do an exercise. Your hand pain or discomfort does not improve within 2 hours after you exercise. If you have any of these problems, stop doing these exercises right away. Do not do them again unless your health care provider says that you can.    Get help right away if: You develop sudden, severe hand pain or swelling. If this happens, stop  doing these exercises right away. Do not do them again unless your health care provider says that you can. This information is not intended to replace advice given to you by your health care provider. Make sure you discuss any questions you have with your  health care provider.

## 2024-01-15 DIAGNOSIS — R7301 Impaired fasting glucose: Secondary | ICD-10-CM | POA: Diagnosis not present

## 2024-01-15 DIAGNOSIS — E782 Mixed hyperlipidemia: Secondary | ICD-10-CM | POA: Diagnosis not present

## 2024-01-18 DIAGNOSIS — S52613A Displaced fracture of unspecified ulna styloid process, initial encounter for closed fracture: Secondary | ICD-10-CM | POA: Insufficient documentation

## 2024-01-18 DIAGNOSIS — S52539A Colles' fracture of unspecified radius, initial encounter for closed fracture: Secondary | ICD-10-CM | POA: Insufficient documentation

## 2024-01-19 ENCOUNTER — Encounter (HOSPITAL_COMMUNITY): Payer: Self-pay | Admitting: Family Medicine

## 2024-01-19 ENCOUNTER — Other Ambulatory Visit (HOSPITAL_COMMUNITY): Payer: Self-pay | Admitting: Internal Medicine

## 2024-01-19 DIAGNOSIS — Z13820 Encounter for screening for osteoporosis: Secondary | ICD-10-CM

## 2024-01-19 DIAGNOSIS — Z79899 Other long term (current) drug therapy: Secondary | ICD-10-CM | POA: Diagnosis not present

## 2024-01-19 DIAGNOSIS — L309 Dermatitis, unspecified: Secondary | ICD-10-CM | POA: Diagnosis not present

## 2024-01-19 DIAGNOSIS — S52611D Displaced fracture of right ulna styloid process, subsequent encounter for closed fracture with routine healing: Secondary | ICD-10-CM | POA: Diagnosis not present

## 2024-01-19 DIAGNOSIS — E782 Mixed hyperlipidemia: Secondary | ICD-10-CM | POA: Diagnosis not present

## 2024-01-19 DIAGNOSIS — S52531D Colles' fracture of right radius, subsequent encounter for closed fracture with routine healing: Secondary | ICD-10-CM | POA: Diagnosis not present

## 2024-01-19 DIAGNOSIS — I10 Essential (primary) hypertension: Secondary | ICD-10-CM | POA: Diagnosis not present

## 2024-01-19 DIAGNOSIS — M8580 Other specified disorders of bone density and structure, unspecified site: Secondary | ICD-10-CM

## 2024-01-19 DIAGNOSIS — Z7984 Long term (current) use of oral hypoglycemic drugs: Secondary | ICD-10-CM | POA: Diagnosis not present

## 2024-01-19 DIAGNOSIS — E119 Type 2 diabetes mellitus without complications: Secondary | ICD-10-CM | POA: Diagnosis not present

## 2024-01-26 ENCOUNTER — Ambulatory Visit (HOSPITAL_COMMUNITY)
Admission: RE | Admit: 2024-01-26 | Discharge: 2024-01-26 | Disposition: A | Discharge status: Home / Self Care | Source: Ambulatory Visit | Attending: Internal Medicine | Admitting: Internal Medicine

## 2024-01-26 DIAGNOSIS — Z13820 Encounter for screening for osteoporosis: Secondary | ICD-10-CM

## 2024-01-26 DIAGNOSIS — Z780 Asymptomatic menopausal state: Secondary | ICD-10-CM | POA: Diagnosis not present

## 2024-01-26 DIAGNOSIS — M8589 Other specified disorders of bone density and structure, multiple sites: Secondary | ICD-10-CM | POA: Diagnosis not present

## 2024-02-10 ENCOUNTER — Ambulatory Visit: Admitting: Orthopedic Surgery

## 2024-03-03 ENCOUNTER — Other Ambulatory Visit (INDEPENDENT_AMBULATORY_CARE_PROVIDER_SITE_OTHER): Payer: Self-pay

## 2024-03-03 ENCOUNTER — Encounter: Payer: Self-pay | Admitting: Orthopedic Surgery

## 2024-03-03 ENCOUNTER — Ambulatory Visit: Admitting: Orthopedic Surgery

## 2024-03-03 DIAGNOSIS — S52531D Colles' fracture of right radius, subsequent encounter for closed fracture with routine healing: Secondary | ICD-10-CM | POA: Diagnosis not present

## 2024-03-03 MED ORDER — PREDNISONE 10 MG (21) PO TBPK: ORAL_TABLET | ORAL | 0 refills | Status: DC

## 2024-03-03 NOTE — Patient Instructions (Signed)
Hand Exercises  Hand exercises can be helpful for almost anyone. These exercises can strengthen the hands, improve flexibility and movement, and increase blood flow to the hands. These results can make work and daily tasks easier. Hand exercises can be especially helpful for people who have joint pain from arthritis or have nerve damage from overuse (carpal tunnel syndrome). These exercises can also help people who have injured a hand.  Exercises Most of these hand exercises are gentle stretching and motion exercises. It is usually safe to do them often throughout the day. Warming up your hands before exercise may help to reduce stiffness. You can do this with gentle massage or by placing your hands in warm water for 10-15 minutes. It is normal to feel some stretching, pulling, tightness, or mild discomfort as you begin new exercises. This will gradually improve. Stop an exercise right away if you feel sudden, severe pain or your pain gets worse. Ask your health care provider which exercises are best for you. Knuckle bend or "claw" fist Stand or sit with your arm, hand, and all five fingers pointed straight up. Make sure to keep your wrist straight during the exercise. Gently bend your fingers down toward your palm until the tips of your fingers are touching the top of your palm. Keep your big knuckle straight and just bend the small knuckles in your fingers. Hold this position for 10 seconds. Straighten (extend) your fingers back to the starting position. Repeat this exercise 5-10 times with each hand. Full finger fist Stand or sit with your arm, hand, and all five fingers pointed straight up. Make sure to keep your wrist straight during the exercise. Gently bend your fingers into your palm until the tips of your fingers are touching the middle of your palm. Hold this position for 10 seconds. Extend your fingers back to the starting position, stretching every joint fully. Repeat this exercise  5-10 times with each hand. Straight fist Stand or sit with your arm, hand, and all five fingers pointed straight up. Make sure to keep your wrist straight during the exercise. Gently bend your fingers at the big knuckle, where your fingers meet your hand, and the middle knuckle. Keep the knuckle at the tips of your fingers straight and try to touch the bottom of your palm. Hold this position for 10 seconds. Extend your fingers back to the starting position, stretching every joint fully. Repeat this exercise 5-10 times with each hand. Tabletop Stand or sit with your arm, hand, and all five fingers pointed straight up. Make sure to keep your wrist straight during the exercise. Gently bend your fingers at the big knuckle, where your fingers meet your hand, as far down as you can while keeping the small knuckles in your fingers straight. Think of forming a tabletop with your fingers. Hold this position for 10 seconds. Extend your fingers back to the starting position, stretching every joint fully. Repeat this exercise 5-10 times with each hand. Finger spread Place your hand flat on a table with your palm facing down. Make sure your wrist stays straight as you do this exercise. Spread your fingers and thumb apart from each other as far as you can until you feel a gentle stretch. Hold this position for 10 seconds. Bring your fingers and thumb tight together again. Hold this position for 10 seconds. Repeat this exercise 5-10 times with each hand. Making circles Stand or sit with your arm, hand, and all five fingers pointed straight up. Make   sure to keep your wrist straight during the exercise. Make a circle by touching the tip of your thumb to the tip of your index finger. Hold for 10 seconds. Then open your hand wide. Repeat this motion with your thumb and each finger on your hand. Repeat this exercise 5-10 times with each hand. Thumb motion Sit with your forearm resting on a table and your wrist  straight. Your thumb should be facing up toward the ceiling. Keep your fingers relaxed as you move your thumb. Lift your thumb up as high as you can toward the ceiling. Hold for 10 seconds. Bend your thumb across your palm as far as you can, reaching the tip of your thumb for the small finger (pinkie) side of your palm. Hold for 10 seconds. Repeat this exercise 5-10 times with each hand. Grip strengthening Hold a stress ball or other soft ball in the middle of your hand. Slowly increase the pressure, squeezing the ball as much as you can without causing pain. Think of bringing the tips of your fingers into the middle of your palm. All of your finger joints should bend when doing this exercise. Hold your squeeze for 10 seconds, then relax. Repeat this exercise 5-10 times with each hand.   Contact a health care provider if: Your hand pain or discomfort gets much worse when you do an exercise. Your hand pain or discomfort does not improve within 2 hours after you exercise. If you have any of these problems, stop doing these exercises right away. Do not do them again unless your health care provider says that you can.    Get help right away if: You develop sudden, severe hand pain or swelling. If this happens, stop doing these exercises right away. Do not do them again unless your health care provider says that you can. This information is not intended to replace advice given to you by your health care provider. Make sure you discuss any questions you have with your health care provider.     Wrist Fracture Rehab Ask your health care provider which exercises are safe for you. Do exercises exactly as told by your health care provider and adjust them as directed. It is normal to feel mild stretching, pulling, tightness, or discomfort as you do these exercises. Stop right away if you feel sudden pain or your pain gets worse. Do not begin these exercises until told by your health care  provider. Stretching and range-of-motion exercises These exercises warm up your muscles and joints and improve the movement and flexibility of your wrist and hand. These exercises also help to relieve pain,numbness, and tingling. Finger flexion and extension Sit or stand with your elbow at your side. Open and stretch your left / right fingers as wide as you can (extension). Hold this position for 10 seconds. Close your left / right fingers into a gentle fist (flexion). Hold this position for 10 seconds. Slowly return to the starting position. Repeat 10 times. Complete this exercise 1-2 times a day. Wrist flexion Bend your left / right elbow to a 90-degree angle (right angle) with your palm facing the floor. Bend your wrist forward so your fingers point toward the floor (flexion). Hold this position for 10 seconds. Slowly return to the starting position. Repeat 10 times. Complete this exercise 1-2 times a day. Wrist extension Bend your left / right elbow to a 90-degree angle (right angle) with your palm facing the floor. Bend your wrist backward so your fingers point toward   the ceiling (extension). Hold this position for 10 seconds. Slowly return to the starting position. Repeat 10 times. Complete this exercise 1-2 times a day. Ulnar deviation Bend your left / right elbow to a 90-degree angle (right angle), and rest your forearm on a table with your palm facing down. Keeping your hand flat on the table, bend your left / right wrist toward your small finger (pinkie). This is ulnar deviation. Hold this position for 10 seconds. Slowly return to the starting position. Repeat 10 times. Complete this exercise 1-2 times a day. Radial deviation Bend your left / right elbow to a 90-degree angle (right angle), and rest your forearm on a table with your palm facing down. Keeping your hand flat on the table, bend your left / right wrist toward your thumb. This is radial deviation. Hold this  position for 10 seconds. Slowly return to the starting position. Repeat 10 times. Complete this exercise 1-2 times a day. Forearm rotation, supination Stand or sit with your left / right elbow bent to a 90-degree angle (right angle) at your side. Position your forearm so that the thumb is facing the ceiling (neutral position). Turn (rotate) your palm up toward the ceiling (supination), stopping when you feel a gentle stretch. Hold this position for 10 seconds. Slowly return to the starting position. Repeat 10 times. Complete this exercise 1-2 times a day. Forearm rotation, pronation Stand or sit with your left / right elbow bent to a 90-degree angle (right angle) at your side. Position your forearm so that the thumb is facing the ceiling (neutral position). Turn (rotate) your palm down toward the floor (pronation), stopping when you feel a gentle stretch. Hold this position for 10 seconds. Slowly return to the starting position. Repeat 10 times. Complete this exercise 1-2 times a day. Wrist flexion stretch  Extend your left / right arm in front of you and turn your palm down toward the floor. If told by your health care provider, bend your left / right arm to a 90-degree angle (right angle) at your side. Using your uninjured hand, gently press over the back of your left / right hand to bend your wrist and fingers toward the floor (flexion). Go as far as you can to feel a stretch without causing pain. Hold this position for 10 seconds. Slowly return to the starting position. Repeat 10 times. Complete this exercise 1-2 times a day. Wrist extension stretch  Extend your left / right arm in front of you and turn your palm up toward the ceiling. If told by your health care provider, bend your left / right arm to a 90-degree angle (right angle) at your side. Using your uninjured hand, gently press over the palm of your left / right hand to bend your wrist and fingers toward the floor (extension).  Go as far as you can to feel a stretch without causing pain. Hold this position for 10 seconds. Slowly return to the starting position. Repeat 10 times. Complete this exercise 1-2 times a day. Forearm rotation stretch, supination Stand or sit with your arms at your sides. Bend your left / right elbow to a 90-degree angle (right angle). Using your uninjured hand, turn your left / right palm up toward the ceiling (assisted supination) until you feel a gentle stretch in the inside of your forearm. Hold this position for 10 seconds. Slowly return to the starting position. Repeat 10 times. Complete this exercise 1-2 times a day. Forearm rotation stretch, pronation Stand or   sit with your arms at your sides. Bend your left / right elbow to a 90-degree angle (right angle). Using your uninjured hand, turn your left / right palm down toward the floor (assisted pronation) until you feel a gentle stretch in the top of your forearm. Hold this position for 10 seconds. Slowly return to the starting position. Repeat 10 times. Complete this exercise 1-2 times a day. Strengthening exercises These exercises build strength and endurance in your wrist and hand. Enduranceis the ability to use your muscles for a long time, even after they get tired. Wrist flexion Sit with your left / right forearm supported on a table. Your elbow should be at waist height. Rest your hand over the edge of the table, palm up. Gently grasp a 5 lb / kg weight (can of soup). Or, hold an exercise band or tube in both hands, keeping your hands at the same level and hip distance apart. There should be slight tension in the exercise band or tube. Without moving your forearm or elbow, slowly bend your wrist up toward the ceiling (wrist flexion). Hold this position for 10 seconds. Slowly return to the starting position. Repeat 10 times. Complete this exercise 1-2 times a day. Wrist extension Sit with your left / right forearm supported  on a table. Your elbow should be at waist height. Rest your hand over the edge of the table, palm down. Gently grasp a 5 lb / kg weight. Or, hold an exercise band or tube in both hands, keeping your hands at the same level and hip distance apart. There should be slight tension in the exercise band or tube. Without moving your forearm or elbow, slowly curl your hand up toward the ceiling (extension). Hold this position for 10 seconds. Slowly return to the starting position. Repeat 10 times. Complete this exercise 1-2 times a day. Forearm rotation, supination  Sit with your left / right forearm supported on a table. Your elbow should be at waist height. Rest your hand over the edge of the table, palm down. Gently grasp a lightweight hammer near the head. As this exercise gets easier for you, try holding the hammer farther down the handle. Without moving your elbow, slowly turn (rotate) your palm up toward the ceiling (supination). Hold this position for 10 seconds. Slowly return to the starting position. Repeat 10 times. Complete this exercise 1-2 times a day. Forearm rotation, pronation  Sit with your left / right forearm supported on a table. Your elbow should be at waist height. Rest your hand over the edge of the table, palm up. Gently grasp a lightweight hammer near the head. As this exercise gets easier for you, try holding the hammer farther down the handle. Without moving your elbow, slowly turn (rotate) your palm down toward the floor (pronation). Hold this position for 10 seconds. Slowly return to the starting position. Repeat 10 times. Complete this exercise 1-2 times a day. Grip strengthening  Hold one of these items in your left / right hand: a dense sponge, a stress ball, or a large, rolled sock. Slowly squeeze the object as hard as you can without increasing any pain. Hold your squeeze for 10 seconds. Slowly release your grip. Repeat 10 times. Complete this exercise 1-2  times a day. This information is not intended to replace advice given to you by your health care provider. Make sure you discuss any questions you have with your healthcare provider. Document Revised: 01/06/2020 Document Reviewed: 01/06/2020 Elsevier Patient Education    2022 [employer]  

## 2024-03-03 NOTE — Progress Notes (Signed)
 Return patient Visit  Assessment: Margaret Cantu is a 68 y.o. female with the following: 1. Closed Colles' fracture of right radius, subsequent encounter 2.  Displaced ulnar styloid fracture  Plan: Margaret Cantu sustained a right distal radius fracture.  Radiographs are stable.  She has a mild deformity.  I do think she will regain function.  She is having some difficulty with motion through the wrist, as well as her fingers.  We discussed multiple exercises, and I provided her with a home exercise program.  She is reluctant to try hand therapy at this time.  I think is reasonable for her to do the exercises on her own.  To help with some of the swelling, I recommended a short course of steroids.  This has been prescribed for her.  As the swelling improves, I think it will help her motion, which could help with her ongoing aches and pains.  Follow-up: Return if symptoms worsen or fail to improve.  Subjective:  Chief Complaint  Patient presents with   Wrist Injury    Right still has pain sometimes it goes into entire arm into shoulder area     History of Present Illness: Margaret Cantu is a 68 y.o. female who returns for evaluation of right wrist pain.  She sustained injury to her right wrist approximately 4 months ago.  She is no longer using a brace.  She takes ibuprofen  as needed.  However, she notes that she has had some issues with her blood pressure.  She still has some stiffness.  She has some radiating pains proximally.  She has been doing exercises on her own.   Review of Systems: No fevers or chills No numbness or tingling No chest pain No shortness of breath No bowel or bladder dysfunction No GI distress No headaches   Objective: There were no vitals taken for this visit.  Physical Exam:  General: Alert and oriented. and No acute distress. Gait: Normal gait.  Mild deformity of the right wrist.  Ulnar styloid is prominent.  No point tenderness.  She  is lacking flexion of the wrist.  Unable to make a full fist.  Fingers are warm and well-perfused.  She has good extension.  4/5 grip strength.  IMAGING: I personally ordered and reviewed the following images  X-rays of the right wrist were obtained in clinic today.  These are compared to prior x-rays.  Stable distal radius fracture, with dorsal angulation.  Dorsal tilt of the joint line, on the lateral projection.  No change in overall alignment.  Minimally displaced ulnar styloid fracture.  Ulnar is positive.  No change in overall alignment.  Impression: Stable right distal radius fracture  New Medications:  Meds ordered this encounter  Medications   predniSONE (STERAPRED UNI-PAK 21 TAB) 10 MG (21) TBPK tablet    Sig: 10 mg DS 12 as directed    Dispense:  48 tablet    Refill:  0      Margaret DELENA Horde, MD  03/03/2024 2:39 PM

## 2024-05-03 DIAGNOSIS — E782 Mixed hyperlipidemia: Secondary | ICD-10-CM | POA: Diagnosis not present

## 2024-05-04 DIAGNOSIS — S52531D Colles' fracture of right radius, subsequent encounter for closed fracture with routine healing: Secondary | ICD-10-CM | POA: Diagnosis not present

## 2024-05-04 DIAGNOSIS — L309 Dermatitis, unspecified: Secondary | ICD-10-CM | POA: Diagnosis not present

## 2024-05-04 DIAGNOSIS — S52611D Displaced fracture of right ulna styloid process, subsequent encounter for closed fracture with routine healing: Secondary | ICD-10-CM | POA: Diagnosis not present

## 2024-05-04 DIAGNOSIS — I10 Essential (primary) hypertension: Secondary | ICD-10-CM | POA: Diagnosis not present

## 2024-05-04 DIAGNOSIS — E119 Type 2 diabetes mellitus without complications: Secondary | ICD-10-CM | POA: Diagnosis not present

## 2024-05-04 DIAGNOSIS — R200 Anesthesia of skin: Secondary | ICD-10-CM | POA: Diagnosis not present

## 2024-05-04 DIAGNOSIS — R519 Headache, unspecified: Secondary | ICD-10-CM | POA: Diagnosis not present

## 2024-05-04 DIAGNOSIS — E782 Mixed hyperlipidemia: Secondary | ICD-10-CM | POA: Diagnosis not present

## 2024-05-04 DIAGNOSIS — M8580 Other specified disorders of bone density and structure, unspecified site: Secondary | ICD-10-CM | POA: Diagnosis not present

## 2024-05-13 DIAGNOSIS — M25631 Stiffness of right wrist, not elsewhere classified: Secondary | ICD-10-CM | POA: Diagnosis not present

## 2024-05-13 DIAGNOSIS — M6281 Muscle weakness (generalized): Secondary | ICD-10-CM | POA: Diagnosis not present

## 2024-05-13 DIAGNOSIS — R200 Anesthesia of skin: Secondary | ICD-10-CM | POA: Diagnosis not present

## 2024-05-13 DIAGNOSIS — M25531 Pain in right wrist: Secondary | ICD-10-CM | POA: Diagnosis not present

## 2024-05-18 DIAGNOSIS — M6281 Muscle weakness (generalized): Secondary | ICD-10-CM | POA: Diagnosis not present

## 2024-05-18 DIAGNOSIS — M25631 Stiffness of right wrist, not elsewhere classified: Secondary | ICD-10-CM | POA: Diagnosis not present

## 2024-05-18 DIAGNOSIS — M25531 Pain in right wrist: Secondary | ICD-10-CM | POA: Diagnosis not present

## 2024-05-18 DIAGNOSIS — R200 Anesthesia of skin: Secondary | ICD-10-CM | POA: Diagnosis not present

## 2024-05-25 DIAGNOSIS — M25531 Pain in right wrist: Secondary | ICD-10-CM | POA: Diagnosis not present

## 2024-05-25 DIAGNOSIS — R200 Anesthesia of skin: Secondary | ICD-10-CM | POA: Diagnosis not present

## 2024-05-25 DIAGNOSIS — M6281 Muscle weakness (generalized): Secondary | ICD-10-CM | POA: Diagnosis not present

## 2024-05-25 DIAGNOSIS — M25631 Stiffness of right wrist, not elsewhere classified: Secondary | ICD-10-CM | POA: Diagnosis not present

## 2024-05-28 DIAGNOSIS — R200 Anesthesia of skin: Secondary | ICD-10-CM | POA: Diagnosis not present

## 2024-05-28 DIAGNOSIS — M25531 Pain in right wrist: Secondary | ICD-10-CM | POA: Diagnosis not present

## 2024-05-28 DIAGNOSIS — M25631 Stiffness of right wrist, not elsewhere classified: Secondary | ICD-10-CM | POA: Diagnosis not present

## 2024-05-28 DIAGNOSIS — M6281 Muscle weakness (generalized): Secondary | ICD-10-CM | POA: Diagnosis not present

## 2024-06-09 DIAGNOSIS — M6281 Muscle weakness (generalized): Secondary | ICD-10-CM | POA: Diagnosis not present

## 2024-06-09 DIAGNOSIS — M25631 Stiffness of right wrist, not elsewhere classified: Secondary | ICD-10-CM | POA: Diagnosis not present

## 2024-06-09 DIAGNOSIS — R200 Anesthesia of skin: Secondary | ICD-10-CM | POA: Diagnosis not present

## 2024-06-09 DIAGNOSIS — M25531 Pain in right wrist: Secondary | ICD-10-CM | POA: Diagnosis not present

## 2024-06-11 ENCOUNTER — Encounter: Payer: Self-pay | Admitting: *Deleted

## 2024-06-11 NOTE — Progress Notes (Signed)
 Margaret Cantu                                          MRN: [id number]   06/11/2024   The [facility] Quality Team Specialist reviewed this patient medical record for the purposes of chart review for care gap closure. The following were reviewed: chart review for care gap closure-kidney health evaluation for diabetes:eGFR  and uACR.    [facility] Quality Team

## 2024-06-16 DIAGNOSIS — M25531 Pain in right wrist: Secondary | ICD-10-CM | POA: Diagnosis not present

## 2024-06-16 DIAGNOSIS — R200 Anesthesia of skin: Secondary | ICD-10-CM | POA: Diagnosis not present

## 2024-06-16 DIAGNOSIS — M25631 Stiffness of right wrist, not elsewhere classified: Secondary | ICD-10-CM | POA: Diagnosis not present

## 2024-06-16 DIAGNOSIS — M6281 Muscle weakness (generalized): Secondary | ICD-10-CM | POA: Diagnosis not present

## 2024-08-04 ENCOUNTER — Encounter: Payer: Self-pay | Admitting: *Deleted

## 2024-08-04 NOTE — Progress Notes (Signed)
 Margaret Cantu                                          MRN: [id number]   08/04/2024   The [facility] Quality Team Specialist reviewed this patient medical record for the purposes of chart review for care gap closure. The following were reviewed: chart review for care gap closure-kidney health evaluation for diabetes:eGFR  and uACR.    [facility] Quality Team

## 2024-08-25 ENCOUNTER — Ambulatory Visit: Admitting: Adult Health

## 2024-08-25 ENCOUNTER — Other Ambulatory Visit (HOSPITAL_COMMUNITY)
Admission: RE | Admit: 2024-08-25 | Discharge: 2024-08-25 | Disposition: A | Discharge status: Home / Self Care | Source: Ambulatory Visit | Attending: Adult Health | Admitting: Adult Health

## 2024-08-25 ENCOUNTER — Encounter: Payer: Self-pay | Admitting: Adult Health

## 2024-08-25 VITALS — BP 138/84 | HR 75 | Ht 61.0 in | Wt 133.5 lb

## 2024-08-25 DIAGNOSIS — L900 Lichen sclerosus et atrophicus: Secondary | ICD-10-CM | POA: Diagnosis not present

## 2024-08-25 DIAGNOSIS — Z01419 Encounter for gynecological examination (general) (routine) without abnormal findings: Secondary | ICD-10-CM

## 2024-08-25 DIAGNOSIS — R8789 Other abnormal findings in specimens from female genital organs: Secondary | ICD-10-CM

## 2024-08-25 DIAGNOSIS — Z1331 Encounter for screening for depression: Secondary | ICD-10-CM

## 2024-08-25 DIAGNOSIS — Z1151 Encounter for screening for human papillomavirus (HPV): Secondary | ICD-10-CM | POA: Diagnosis not present

## 2024-08-25 MED ORDER — CLOBETASOL PROPIONATE 0.05 % EX OINT: TOPICAL_OINTMENT | CUTANEOUS | 3 refills | Status: AC

## 2024-08-25 NOTE — Progress Notes (Signed)
 Patient ID: Margaret Cantu, female   DOB: [birth year], 68 y.o.   MRN: [id number] History of Present Illness: Margaret Cantu is a 68 year old white female, widowed, PM in for a well woman gyn exam and pap. Pap 04/18/23 was NILM +HR HPV. She may have to go to [place]  her brother is sick and sister is tired.  PCP is Dr Shona   Current Medications, Allergies, Past Medical History, Past Surgical History, Family History and Social History were reviewed in [facility] electronic medical record.     Review of Systems: Patient denies any headaches, hearing loss, fatigue, blurred vision, shortness of breath, chest pain, abdominal pain, problems with bowel movements, urination, or intercourse. No joint pain or mood swings.  Denis any vaginal bleeding Did have sharp pain in pelvic area for just a short time then none since   Physical Exam:BP 138/84 (BP Location: Left Arm, Patient Position: Sitting, Cuff Size: Normal)   Pulse 75   Ht 5' 1 (1.549 m)   Wt 133 lb 8 oz (60.6 kg)   BMI 25.22 kg/m   General:  Well developed, well nourished, no acute distress Skin:  Warm and dry Neck:  Midline trachea, normal thyroid , good ROM, no lymphadenopathy, no carotid bruits heard Lungs; Clear to auscultation bilaterally Breast:  No dominant palpable mass, retraction, or nipple discharge Cardiovascular: Regular rate and rhythm Abdomen:  Soft, non tender, no hepatosplenomegaly Pelvic:  External genitalia is normal in appearance, no lesions.  The vagina is pale, skin is thin at introitus. Urethra has no lesions or masses. The cervix is smooth with stenotic os, pap with HR HPV genotyping performed.  Uterus is felt to be normal size, shape, and contour.  No adnexal masses or tenderness noted.Bladder is non tender, no masses felt. Rectal:deferred Extremities/musculoskeletal:  No swelling or varicosities noted, no clubbing or cyanosis Psych:  No mood changes, alert and cooperative,seems happy AA is 0 Fall risk is  low    08/25/2024    1:45 PM 04/18/2023    8:38 AM 12/18/2021    1:46 PM  Depression screen PHQ 2/9  Decreased Interest 0 0 0  Down, Depressed, Hopeless 0 0 1  PHQ - 2 Score 0 0 1  Altered sleeping 0 3 3  Tired, decreased energy 0 1 3  Change in appetite 0 0 0  Feeling bad or failure about yourself  0 0 0  Trouble concentrating 0 0 3  Moving slowly or fidgety/restless 0 0 0  Suicidal thoughts 0 0 0  PHQ-9 Score 0 4  10      Data saved with a previous flowsheet row definition       08/25/2024    1:45 PM 04/18/2023    8:38 AM 12/08/2020    1:00 PM  GAD 7 : Generalized Anxiety Score  Nervous, Anxious, on Edge 0 3 3  Control/stop worrying 0 3 3  Worry too much - different things 0 3 3  Trouble relaxing 0 3 3  Restless 0 3 3  Easily annoyed or irritable 0 3 3  Afraid - awful might happen 0 3 3  Total GAD 7 Score 0 21 21    Upstream - 08/25/24 1340       Pregnancy Intention Screening   Does the patient want to become pregnant in the next year? N/A    Does the patient's partner want to become pregnant in the next year? N/A    Would the patient like to discuss contraceptive options  today? N/A      Contraception Wrap Up   Current Method Post-Menopause    End Method Post-Menopause    Contraception Counseling Provided No           Examination chaperoned by Clarita Salt LPN   Impression and plan: 1. Encounter for gynecological examination with Papanicolaou smear of cervix (Primary) Pap sent Next pap TBD Mammogram was negative 07/17/23 Labs with PCP Cologuard was negative 2023 Stay active  - Cytology - PAP( [place])  2. Lichen sclerosus et atrophicus No itching lately Will refill temovate  Meds ordered this encounter  Medications   clobetasol  ointment (TEMOVATE ) 0.05 %    Sig: Use  2-3 x weekly to external affected areas    Dispense:  45 g    Refill:  3    Supervising Provider:   JAYNE MINDER H [[id number]]    3. High risk human papillomavirus (HPV) DNA test  positive Pap sent

## 2024-08-30 LAB — CYTOLOGY - PAP
Adequacy: ABSENT
Comment: NEGATIVE
Diagnosis: NEGATIVE
High risk HPV: NEGATIVE

## 2024-09-01 ENCOUNTER — Ambulatory Visit: Payer: Self-pay | Admitting: Adult Health
